# Patient Record
Sex: Male | Born: 1955 | Race: White | Hispanic: No | Smoking: Current every day smoker
Health system: Southern US, Community
[De-identification: ages and names within clinical notes are randomized; demographics above are authoritative.]

## PROBLEM LIST (undated history)

## (undated) DIAGNOSIS — I2510 Atherosclerotic heart disease of native coronary artery without angina pectoris: Secondary | ICD-10-CM

## (undated) DIAGNOSIS — Z8719 Personal history of other diseases of the digestive system: Secondary | ICD-10-CM

## (undated) DIAGNOSIS — M1990 Unspecified osteoarthritis, unspecified site: Secondary | ICD-10-CM

## (undated) DIAGNOSIS — G709 Myoneural disorder, unspecified: Secondary | ICD-10-CM

## (undated) DIAGNOSIS — I10 Essential (primary) hypertension: Secondary | ICD-10-CM

## (undated) DIAGNOSIS — T8859XA Other complications of anesthesia, initial encounter: Secondary | ICD-10-CM

## (undated) DIAGNOSIS — R011 Cardiac murmur, unspecified: Secondary | ICD-10-CM

## (undated) DIAGNOSIS — E119 Type 2 diabetes mellitus without complications: Secondary | ICD-10-CM

## (undated) DIAGNOSIS — F419 Anxiety disorder, unspecified: Secondary | ICD-10-CM

## (undated) DIAGNOSIS — F32A Depression, unspecified: Secondary | ICD-10-CM

## (undated) HISTORY — PX: AORTIC VALVE REPAIR: SHX6306

## (undated) HISTORY — PX: EYE SURGERY: SHX253

## (undated) HISTORY — PX: BONY PELVIS SURGERY: SHX572

## (undated) HISTORY — PX: ABDOMINAL HERNIA REPAIR: SHX539

---

## 2013-03-16 ENCOUNTER — Encounter (INDEPENDENT_AMBULATORY_CARE_PROVIDER_SITE_OTHER): Payer: BC Managed Care – PPO | Admitting: Ophthalmology

## 2013-03-16 DIAGNOSIS — H35039 Hypertensive retinopathy, unspecified eye: Secondary | ICD-10-CM

## 2013-03-16 DIAGNOSIS — H43819 Vitreous degeneration, unspecified eye: Secondary | ICD-10-CM

## 2013-03-16 DIAGNOSIS — H3581 Retinal edema: Secondary | ICD-10-CM

## 2013-03-16 DIAGNOSIS — E11359 Type 2 diabetes mellitus with proliferative diabetic retinopathy without macular edema: Secondary | ICD-10-CM

## 2013-03-16 DIAGNOSIS — I10 Essential (primary) hypertension: Secondary | ICD-10-CM

## 2013-03-16 DIAGNOSIS — E1139 Type 2 diabetes mellitus with other diabetic ophthalmic complication: Secondary | ICD-10-CM

## 2013-03-16 DIAGNOSIS — H4310 Vitreous hemorrhage, unspecified eye: Secondary | ICD-10-CM

## 2013-04-16 ENCOUNTER — Other Ambulatory Visit (INDEPENDENT_AMBULATORY_CARE_PROVIDER_SITE_OTHER): Payer: BC Managed Care – PPO | Admitting: Ophthalmology

## 2013-04-16 DIAGNOSIS — E1165 Type 2 diabetes mellitus with hyperglycemia: Secondary | ICD-10-CM

## 2013-04-16 DIAGNOSIS — E11359 Type 2 diabetes mellitus with proliferative diabetic retinopathy without macular edema: Secondary | ICD-10-CM

## 2013-04-16 DIAGNOSIS — E1139 Type 2 diabetes mellitus with other diabetic ophthalmic complication: Secondary | ICD-10-CM

## 2013-08-16 ENCOUNTER — Ambulatory Visit (INDEPENDENT_AMBULATORY_CARE_PROVIDER_SITE_OTHER): Payer: BC Managed Care – PPO | Admitting: Ophthalmology

## 2013-08-16 DIAGNOSIS — H2510 Age-related nuclear cataract, unspecified eye: Secondary | ICD-10-CM

## 2013-08-16 DIAGNOSIS — E1139 Type 2 diabetes mellitus with other diabetic ophthalmic complication: Secondary | ICD-10-CM

## 2013-08-16 DIAGNOSIS — E1165 Type 2 diabetes mellitus with hyperglycemia: Secondary | ICD-10-CM

## 2013-08-16 DIAGNOSIS — H35039 Hypertensive retinopathy, unspecified eye: Secondary | ICD-10-CM

## 2013-08-16 DIAGNOSIS — H43819 Vitreous degeneration, unspecified eye: Secondary | ICD-10-CM

## 2013-08-16 DIAGNOSIS — E11319 Type 2 diabetes mellitus with unspecified diabetic retinopathy without macular edema: Secondary | ICD-10-CM

## 2013-08-16 DIAGNOSIS — I10 Essential (primary) hypertension: Secondary | ICD-10-CM

## 2014-03-07 ENCOUNTER — Ambulatory Visit (INDEPENDENT_AMBULATORY_CARE_PROVIDER_SITE_OTHER): Payer: BC Managed Care – PPO | Admitting: Ophthalmology

## 2015-11-15 ENCOUNTER — Other Ambulatory Visit (HOSPITAL_COMMUNITY): Payer: Self-pay | Admitting: Endocrinology

## 2015-11-15 ENCOUNTER — Ambulatory Visit (HOSPITAL_COMMUNITY): Payer: BLUE CROSS/BLUE SHIELD | Attending: Cardiovascular Disease

## 2015-11-15 ENCOUNTER — Other Ambulatory Visit: Payer: Self-pay

## 2015-11-15 DIAGNOSIS — I517 Cardiomegaly: Secondary | ICD-10-CM | POA: Diagnosis not present

## 2015-11-15 DIAGNOSIS — I350 Nonrheumatic aortic (valve) stenosis: Secondary | ICD-10-CM

## 2015-11-16 ENCOUNTER — Other Ambulatory Visit: Payer: Self-pay | Admitting: Endocrinology

## 2015-11-16 DIAGNOSIS — I350 Nonrheumatic aortic (valve) stenosis: Secondary | ICD-10-CM

## 2015-11-21 ENCOUNTER — Telehealth: Payer: Self-pay | Admitting: Cardiovascular Disease

## 2015-11-21 NOTE — Telephone Encounter (Signed)
Received records from [facility] for appointment on 11/28/15 with Dr Berry.  Records given to N Hines (medical records) for Dr Berry's schedule on 11/28/15. lp °

## 2015-11-28 ENCOUNTER — Ambulatory Visit (INDEPENDENT_AMBULATORY_CARE_PROVIDER_SITE_OTHER): Payer: BLUE CROSS/BLUE SHIELD | Admitting: Cardiovascular Disease

## 2015-11-28 ENCOUNTER — Encounter: Payer: Self-pay | Admitting: Cardiovascular Disease

## 2015-11-28 VITALS — BP 126/80 | HR 62 | Ht 67.0 in | Wt 174.8 lb

## 2015-11-28 DIAGNOSIS — I209 Angina pectoris, unspecified: Secondary | ICD-10-CM | POA: Diagnosis not present

## 2015-11-28 DIAGNOSIS — E119 Type 2 diabetes mellitus without complications: Secondary | ICD-10-CM | POA: Insufficient documentation

## 2015-11-28 DIAGNOSIS — R0989 Other specified symptoms and signs involving the circulatory and respiratory systems: Secondary | ICD-10-CM

## 2015-11-28 DIAGNOSIS — I350 Nonrheumatic aortic (valve) stenosis: Secondary | ICD-10-CM | POA: Diagnosis not present

## 2015-11-28 DIAGNOSIS — R079 Chest pain, unspecified: Secondary | ICD-10-CM | POA: Insufficient documentation

## 2015-11-28 DIAGNOSIS — I10 Essential (primary) hypertension: Secondary | ICD-10-CM | POA: Insufficient documentation

## 2015-11-28 NOTE — Patient Instructions (Addendum)
Medication Instructions:  Continue current medications  Labwork: None Ordered  Testing/Procedures: Your physician has requested that you have a lexiscan myoview. For further information please visit [email or web address]. Please follow instruction sheet, as given.  Your physician has requested that you have a carotid duplex. This test is an ultrasound of the carotid arteries in your neck. It looks at blood flow through these arteries that supply the brain with blood. Allow one hour for this exam. There are no restrictions or special instructions.  Follow-Up: Your physician recommends that you schedule a follow-up appointment in: After test   Any Other Special Instructions Will Be Listed Below (If Applicable).   If you need a refill on your cardiac medications before your next appointment, please call your pharmacy.

## 2015-11-28 NOTE — Progress Notes (Signed)
11/28/2015 Saw Jonathon Rojas   09/30/1955  [id number]  Primary Physician Jonathon Hy, MD Primary Cardiologist: Runell Gess MD Jonathon Rojas  HPI:  Jonathon Rojas is a 60 year old mildly overweight married Caucasian male father of 2, grandfather and 5 grandchildren and is accompanied by his wife today. He is referred by Dr. Evlyn Kanner for evaluation of moderate aortic stenosis and chest pain. He was retired from [employer] since 1997 secondary to disability. His cardiovascular risk factor is notable for 50-70 pack years of tobacco abuse currently smoking one to 2 packs a day. He does have treated hypertension and diabetes. He has never had a heart attack or stroke. Does get some exertional chest discomfort. A recent 2-D echo performed 11/15/15 revealed normal LV size and function, grade 2 diastolic dysfunction and moderate aortic stenosis with a valve area 0.86 cm2 and a peak gradient of 62 mmHg.   Current Outpatient Prescriptions  Medication Sig Dispense Refill  . ALPRAZolam (XANAX) 0.25 MG tablet Take 1 tablet by mouth as needed.    . Azelastine HCl 0.15 % SOLN Place 1 puff into the nose 2 (two) times daily.    . carisoprodol (SOMA) 350 MG tablet Take 350 mg by mouth 4 (four) times daily.    Marland Kitchen diltiazem (TIAZAC) 360 MG 24 hr capsule Take 1 capsule by mouth daily.    Marland Kitchen oxyCODONE-acetaminophen (PERCOCET) 7.5-325 MG tablet Take 1 tablet by mouth 4 (four) times daily.    . pantoprazole (PROTONIX) 40 MG tablet Take 1 tablet by mouth 2 (two) times daily.    Marland Kitchen zolpidem (AMBIEN) 10 MG tablet Take 1 tablet by mouth at bedtime.     No current facility-administered medications for this visit.     Allergies  Allergen Reactions  . Canagliflozin Swelling  . Cyclobenzaprine Hives  . Ibuprofen Hives  . Metformin And Related Swelling  . Other Hives  . Robaxin [Methocarbamol] Hives    Social History   Social History  . Marital status: Significant Other    Spouse name: N/A  . Number of  children: N/A  . Years of education: N/A   Occupational History  . Not on file.   Social History Main Topics  . Smoking status: Not on file  . Smokeless tobacco: Not on file  . Alcohol use Not on file  . Drug use: Unknown  . Sexual activity: Not on file   Other Topics Concern  . Not on file   Social History Narrative  . No narrative on file     Review of Systems: General: negative for chills, fever, night sweats or weight changes.  Cardiovascular: negative for chest pain, dyspnea on exertion, edema, orthopnea, palpitations, paroxysmal nocturnal dyspnea or shortness of breath Dermatological: negative for rash Respiratory: negative for cough or wheezing Urologic: negative for hematuria Abdominal: negative for nausea, vomiting, diarrhea, bright red blood per rectum, melena, or hematemesis Neurologic: negative for visual changes, syncope, or dizziness All other systems reviewed and are otherwise negative except as noted above.    Blood pressure 126/80, pulse 62, height 5\' 7"  (1.702 m), weight 174 lb 12.8 oz (79.3 kg).  General appearance: alert and no distress Neck: no adenopathy, no JVD, supple, symmetrical, trachea midline, thyroid not enlarged, symmetric, no tenderness/mass/nodules and Bilateral carotid bruits versus transmitted murmur Lungs: clear to auscultation bilaterally Heart: 2/6 systolic ejection murmur at the base as well as apex consistent with aortic stenosis Extremities: extremities normal, atraumatic, no cyanosis or edema  EKG normal sinus  rhythm at 63 without ST or T-wave changes. I personally reviewed this EKG  ASSESSMENT AND PLAN:   Aortic stenosis, moderate [name] recently had a 2-D echocardiogram performed 11/15/15 revealing normal LV size and function with mild LVH and moderate aortic stenosis. His valve area was 0.86 cm 2 with a peak gradient of 62 mmHg. He does get occasional chest discomfort when walking up stairs.  Essential hypertension History  of hypertension blood pressure measured 126/80. He is on diltiazem. Continue current meds at current dosing  Chest pain The patient describes occasional exertional chest pain walking up stairs. He does have moderate aortic stenosis as well as risk factors that include hypertension, diabetes and 50-75 pack years of tobacco abuse currently smoking one to 2 packs a day. I'm going to get a formal neurologic Myoview stress test to rule out an ischemic etiology.      Runell GessJonathan J. Johna Kearl MD FACP,FACC,FAHA, [facility] 11/28/2015 11:04 AM

## 2015-11-28 NOTE — Assessment & Plan Note (Signed)
The patient describes occasional exertional chest pain walking up stairs. He does have moderate aortic stenosis as well as risk factors that include hypertension, diabetes and 50-75 pack years of tobacco abuse currently smoking one to 2 packs a day. I'm going to get a formal neurologic Myoview stress test to rule out an ischemic etiology.

## 2015-11-28 NOTE — Assessment & Plan Note (Signed)
Mr. Jonathon Rojas recently had a 2-D echocardiogram performed 11/15/15 revealing normal LV size and function with mild LVH and moderate aortic stenosis. His valve area was 0.86 cm 2 with a peak gradient of 62 mmHg. He does get occasional chest discomfort when walking up stairs.

## 2015-11-28 NOTE — Assessment & Plan Note (Signed)
History of hypertension blood pressure measured 126/80. He is on diltiazem. Continue current meds at current dosing

## 2015-12-08 ENCOUNTER — Telehealth (HOSPITAL_COMMUNITY): Payer: Self-pay

## 2015-12-08 NOTE — Telephone Encounter (Signed)
Encounter complete. 

## 2015-12-13 ENCOUNTER — Ambulatory Visit (HOSPITAL_COMMUNITY)
Admission: RE | Admit: 2015-12-13 | Discharge: 2015-12-13 | Disposition: A | Discharge status: Home / Self Care | Payer: BLUE CROSS/BLUE SHIELD | Source: Ambulatory Visit | Attending: Cardiovascular Disease | Admitting: Cardiovascular Disease

## 2015-12-13 DIAGNOSIS — I10 Essential (primary) hypertension: Secondary | ICD-10-CM | POA: Insufficient documentation

## 2015-12-13 DIAGNOSIS — E119 Type 2 diabetes mellitus without complications: Secondary | ICD-10-CM | POA: Insufficient documentation

## 2015-12-13 DIAGNOSIS — Z720 Tobacco use: Secondary | ICD-10-CM | POA: Insufficient documentation

## 2015-12-13 DIAGNOSIS — I6523 Occlusion and stenosis of bilateral carotid arteries: Secondary | ICD-10-CM | POA: Insufficient documentation

## 2015-12-13 DIAGNOSIS — R079 Chest pain, unspecified: Secondary | ICD-10-CM | POA: Diagnosis not present

## 2015-12-13 DIAGNOSIS — R0989 Other specified symptoms and signs involving the circulatory and respiratory systems: Secondary | ICD-10-CM | POA: Diagnosis present

## 2015-12-13 DIAGNOSIS — I350 Nonrheumatic aortic (valve) stenosis: Secondary | ICD-10-CM | POA: Insufficient documentation

## 2015-12-13 LAB — MYOCARDIAL PERFUSION IMAGING
CHL CUP NUCLEAR SDS: 1
CHL CUP RESTING HR STRESS: 60 {beats}/min
CSEPPHR: 64 {beats}/min
LV sys vol: 54 mL
LVDIAVOL: 123 mL (ref 62–150)
NUC STRESS TID: 1.28
SRS: 0
SSS: 1

## 2015-12-13 MED ORDER — AMINOPHYLLINE 25 MG/ML IV SOLN
75.0000 mg | Freq: Once | INTRAVENOUS | Status: AC
  Administered 2015-12-13: 75 mg via INTRAVENOUS

## 2015-12-13 MED ORDER — REGADENOSON 0.4 MG/5ML IV SOLN
0.4000 mg | Freq: Once | INTRAVENOUS | Status: AC
  Administered 2015-12-13: 0.4 mg via INTRAVENOUS

## 2015-12-13 MED ORDER — TECHNETIUM TC 99M TETROFOSMIN IV KIT
31.2000 | PACK | Freq: Once | INTRAVENOUS | Status: AC | PRN
  Administered 2015-12-13: 31.2 via INTRAVENOUS
  Filled 2015-12-13: qty 31

## 2015-12-13 MED ORDER — TECHNETIUM TC 99M TETROFOSMIN IV KIT
10.2000 | PACK | Freq: Once | INTRAVENOUS | Status: AC | PRN
  Administered 2015-12-13: 10.2 via INTRAVENOUS
  Filled 2015-12-13: qty 10

## 2015-12-14 DIAGNOSIS — R0989 Other specified symptoms and signs involving the circulatory and respiratory systems: Secondary | ICD-10-CM | POA: Diagnosis not present

## 2016-01-02 ENCOUNTER — Encounter: Payer: Self-pay | Admitting: Cardiovascular Disease

## 2016-01-02 ENCOUNTER — Ambulatory Visit (INDEPENDENT_AMBULATORY_CARE_PROVIDER_SITE_OTHER): Payer: BLUE CROSS/BLUE SHIELD | Admitting: Cardiovascular Disease

## 2016-01-02 VITALS — BP 149/74 | HR 60 | Ht 67.0 in | Wt 175.4 lb

## 2016-01-02 DIAGNOSIS — I350 Nonrheumatic aortic (valve) stenosis: Secondary | ICD-10-CM

## 2016-01-02 NOTE — Patient Instructions (Signed)
Medication Instructions:  Continue on current medications.   Testing/Procedures: Your physician has requested that you have an echocardiogram. Echocardiography is a painless test that uses sound waves to create images of your heart. It provides your doctor with information about the size and shape of your heart and how well your heart's chambers and valves are working. This procedure takes approximately one hour. There are no restrictions for this procedure.  DUE IN AUGUST 2018.    Follow-Up: We request that you follow-up in: 6 MONTHS with an extender and in 12 MONTHS with Dr San MorelleBerry  You will receive a reminder letter in the mail two months in advance. If you don't receive a letter, please call our office to schedule the follow-up appointment.   If you need a refill on your cardiac medications before your next appointment, please call your pharmacy.

## 2016-01-02 NOTE — Assessment & Plan Note (Signed)
Mr. Jonathon Rojas returns today for evaluation of chest pain. Myoview stress test was nonischemic. His chest pain is somewhat atypical and sounds more like reflux. She does have moderate aortic stenosis. We will recheck a 2-D echo in 1 year. He'll see a PA back in 6 months to me back in one year. She had progressive symptoms he will need a right left heart cath.

## 2016-01-02 NOTE — Progress Notes (Signed)
Mr. Teigen returns today for evaluation of chest pain. Myoview stress test was nonischemic. His chest pain is somewhat atypical and sounds more like reflux. She does have moderate aortic stenosis. We will recheck a 2-D echo in 1 year. He'll see a PA back in 6 months to me back in one year. She had progressive symptoms he will need a right left heart cath. 

## 2016-06-13 ENCOUNTER — Encounter (HOSPITAL_COMMUNITY): Payer: Self-pay | Admitting: Cardiovascular Disease

## 2016-11-15 ENCOUNTER — Ambulatory Visit (HOSPITAL_COMMUNITY): Payer: BLUE CROSS/BLUE SHIELD | Attending: Cardiovascular Disease

## 2016-11-15 ENCOUNTER — Other Ambulatory Visit: Payer: Self-pay

## 2016-11-15 DIAGNOSIS — R079 Chest pain, unspecified: Secondary | ICD-10-CM | POA: Diagnosis not present

## 2016-11-15 DIAGNOSIS — I350 Nonrheumatic aortic (valve) stenosis: Secondary | ICD-10-CM | POA: Diagnosis present

## 2016-11-21 ENCOUNTER — Telehealth: Payer: Self-pay | Admitting: Cardiovascular Disease

## 2016-11-21 NOTE — Telephone Encounter (Signed)
Follow up     If you cant reach him on his number use his wifes number [phone] Jonathon Rojas

## 2016-11-21 NOTE — Telephone Encounter (Signed)
New Message     Pt would like a call back regarding the results for the echo, before his appt on 01/07/17

## 2016-11-21 NOTE — Telephone Encounter (Signed)
Results of echo given to pt. Pt wanted to schedule a sooner appt with Dr. Allyson SabalBerry. Appt previously scheduled for 10/23 was changed to 9/18 at 4 pm with Dr. Allyson SabalBerry to further discuss echo results.

## 2016-11-26 ENCOUNTER — Ambulatory Visit: Payer: BLUE CROSS/BLUE SHIELD | Admitting: Cardiovascular Disease

## 2016-12-03 ENCOUNTER — Ambulatory Visit (INDEPENDENT_AMBULATORY_CARE_PROVIDER_SITE_OTHER): Payer: BLUE CROSS/BLUE SHIELD | Admitting: Cardiovascular Disease

## 2016-12-03 ENCOUNTER — Encounter: Payer: Self-pay | Admitting: Cardiovascular Disease

## 2016-12-03 VITALS — BP 150/79 | HR 60 | Ht 68.0 in | Wt 175.6 lb

## 2016-12-03 DIAGNOSIS — I350 Nonrheumatic aortic (valve) stenosis: Secondary | ICD-10-CM

## 2016-12-03 DIAGNOSIS — I10 Essential (primary) hypertension: Secondary | ICD-10-CM

## 2016-12-03 NOTE — Assessment & Plan Note (Signed)
History of essential hypertension blood pressure measured at 150/79. He is on diltiazem. Continue current meds at current dosing

## 2016-12-03 NOTE — Patient Instructions (Signed)
Medication Instructions: Your physician recommends that you continue on your current medications as directed. Please refer to the Current Medication list given to you today.  Testing/Procedures: Your physician has requested that you have an echocardiogram in 6 months. Echocardiography is a painless test that uses sound waves to create images of your heart. It provides your doctor with information about the size and shape of your heart and how well your heart's chambers and valves are working. This procedure takes approximately one hour. There are no restrictions for this procedure.  Follow-Up: Your physician wants you to follow-up in: 6 months with Dr. Gerrie Nordmann echo is completed. You will receive a reminder letter in the mail two months in advance. If you don't receive a letter, please call our office to schedule the follow-up appointment.  If you need a refill on your cardiac medications before your next appointment, please call your pharmacy.

## 2016-12-03 NOTE — Assessment & Plan Note (Signed)
History of progressive and now severe aortic stenosis with a 2-D echo performed 11/15/16 revealing an aortic valve area of 0.72 cm with a peak gradient of 77 which has increased from 62 mmHg one year ago. He remains asymptomatic. I will recheck a 2-D echo in 6 months.

## 2016-12-03 NOTE — Progress Notes (Signed)
12/03/2016 [name]   1956/03/10  [id number]  Primary Physician Adrian Prince, MD Primary Cardiologist: Runell Gess MD Nicholes Calamity, [place]  HPI:  Jonathon Rojas is a 61 y.o. male mildly overweight married Caucasian male father of 2, grandfather and 5 grandchildren and is accompanied by his wife today. He is referred by Dr. Evlyn Kanner for evaluation of moderate aortic stenosis and chest pain. He was retired from [employer] since 1997 secondary to disability. His cardiovascular risk factor is notable for 50-70 pack years of tobacco abuse currently smoking one to 2 packs a day. He does have treated hypertension and diabetes. He has never had a heart attack or stroke. Does get some exertional chest discomfort. A recent 2-D echo performed 11/15/15 revealed normal LV size and function, grade 2 diastolic dysfunction and moderate aortic stenosis with a valve area 0.86 cm2 and a peak gradient of 62 mmHg.  Since I saw me ago he's remained medically stable Chest pain or shortness of breath. Recent 2-D echo performed 11/15/16 revealed an aortic valve area 0.72 cm with an increase in his gradient of 62 mmHg to 77 mmHg.   Current Meds  Medication Sig  . ALPRAZolam (XANAX) 0.25 MG tablet Take 1 tablet by mouth as needed.  . Azelastine HCl 0.15 % SOLN Place 1 puff into the nose 2 (two) times daily.  . carisoprodol (SOMA) 350 MG tablet Take 350 mg by mouth 4 (four) times daily.  . Diclofenac Sodium (PENNSAID) 1.5 % SOLN Place onto the skin daily.  Marland Kitchen diltiazem (TIAZAC) 360 MG 24 hr capsule Take 1 capsule by mouth daily.  Marland Kitchen oxyCODONE-acetaminophen (PERCOCET) 7.5-325 MG tablet Take 1 tablet by mouth 4 (four) times daily.  . pantoprazole (PROTONIX) 40 MG tablet Take 1 tablet by mouth 2 (two) times daily.  Marland Kitchen zolpidem (AMBIEN) 10 MG tablet Take 1 tablet by mouth at bedtime.     Allergies  Allergen Reactions  . Ivp Dye [Iodinated Diagnostic Agents] Anaphylaxis    Pt has EPI PEN for this allergy.  .  Shellfish Allergy Anaphylaxis    Pt has EPI PEN for this allergy.  . Canagliflozin Swelling  . Flexeril [Cyclobenzaprine] Hives  . Ibuprofen Hives  . Metformin And Related Swelling  . Other Hives  . Robaxin [Methocarbamol] Hives    Social History   Social History  . Marital status: Significant Other    Spouse name: N/A  . Number of children: N/A  . Years of education: N/A   Occupational History  . Not on file.   Social History Main Topics  . Smoking status: Current Every Day Smoker    Packs/day: 1.00  . Smokeless tobacco: Not on file  . Alcohol use Not on file  . Drug use: Unknown  . Sexual activity: Not on file   Other Topics Concern  . Not on file   Social History Narrative  . No narrative on file     Review of Systems: General: negative for chills, fever, night sweats or weight changes.  Cardiovascular: negative for chest pain, dyspnea on exertion, edema, orthopnea, palpitations, paroxysmal nocturnal dyspnea or shortness of breath Dermatological: negative for rash Respiratory: negative for cough or wheezing Urologic: negative for hematuria Abdominal: negative for nausea, vomiting, diarrhea, bright red blood per rectum, melena, or hematemesis Neurologic: negative for visual changes, syncope, or dizziness All other systems reviewed and are otherwise negative except as noted above.    Blood pressure (!) 150/79, pulse 60, height  (1.727  m), weight 175 lb 9.6 oz (79.7 kg).  General appearance: alert and no distress Neck: no adenopathy, no JVD, supple, symmetrical, trachea midline, thyroid not enlarged, symmetric, no tenderness/mass/nodules and Soft bilateral carotid bruits versus transmitted murmur Lungs: clear to auscultation bilaterally Heart: 2/6 systolic ejection murmur at the base consistent with aortic stenosis. Extremities: extremities normal, atraumatic, no cyanosis or edema Pulses: 2+ and symmetric Skin: Skin color, texture, turgor normal. No rashes  or lesions Neurologic: Alert and oriented X 3, normal strength and tone. Normal symmetric reflexes. Normal coordination and gait  EKG not performed today  ASSESSMENT AND PLAN:   Aortic stenosis, moderate History of progressive and now severe aortic stenosis with a 2-D echo performed 11/15/16 revealing an aortic valve area of 0.72 cm with a peak gradient of 77 which has increased from 62 mmHg one year ago. He remains asymptomatic. I will recheck a 2-D echo in 6 months.  Essential hypertension History of essential hypertension blood pressure measured at 150/79. He is on diltiazem. Continue current meds at current dosing      Runell Gess MD [facility], [facility] 12/03/2016 4:25 PM

## 2016-12-26 ENCOUNTER — Encounter (HOSPITAL_COMMUNITY): Payer: Self-pay | Admitting: Emergency Medicine

## 2016-12-26 ENCOUNTER — Emergency Department (HOSPITAL_COMMUNITY): Payer: BLUE CROSS/BLUE SHIELD

## 2016-12-26 ENCOUNTER — Emergency Department (HOSPITAL_COMMUNITY)
Admission: EM | Admit: 2016-12-26 | Discharge: 2016-12-27 | Disposition: A | Discharge status: Home / Self Care | Payer: BLUE CROSS/BLUE SHIELD | Attending: Emergency Medicine | Admitting: Emergency Medicine

## 2016-12-26 DIAGNOSIS — E119 Type 2 diabetes mellitus without complications: Secondary | ICD-10-CM | POA: Diagnosis not present

## 2016-12-26 DIAGNOSIS — I10 Essential (primary) hypertension: Secondary | ICD-10-CM | POA: Insufficient documentation

## 2016-12-26 DIAGNOSIS — J189 Pneumonia, unspecified organism: Secondary | ICD-10-CM | POA: Diagnosis not present

## 2016-12-26 DIAGNOSIS — Z79899 Other long term (current) drug therapy: Secondary | ICD-10-CM | POA: Diagnosis not present

## 2016-12-26 DIAGNOSIS — R0602 Shortness of breath: Secondary | ICD-10-CM | POA: Diagnosis present

## 2016-12-26 LAB — CBC
HEMATOCRIT: 33.7 % — AB (ref 39.0–52.0)
HEMOGLOBIN: 11.7 g/dL — AB (ref 13.0–17.0)
MCH: 28.5 pg (ref 26.0–34.0)
MCHC: 34.7 g/dL (ref 30.0–36.0)
MCV: 82.2 fL (ref 78.0–100.0)
Platelets: 382 10*3/uL (ref 150–400)
RBC: 4.1 MIL/uL — AB (ref 4.22–5.81)
RDW: 13.2 % (ref 11.5–15.5)
WBC: 17 10*3/uL — ABNORMAL HIGH (ref 4.0–10.5)

## 2016-12-26 LAB — BASIC METABOLIC PANEL
ANION GAP: 12 (ref 5–15)
BUN: 10 mg/dL (ref 6–20)
CHLORIDE: 92 mmol/L — AB (ref 101–111)
CO2: 25 mmol/L (ref 22–32)
CREATININE: 0.59 mg/dL — AB (ref 0.61–1.24)
Calcium: 8.8 mg/dL — ABNORMAL LOW (ref 8.9–10.3)
GFR calc non Af Amer: >60 mL/min (ref >60)
Glucose, Bld: 162 mg/dL — ABNORMAL HIGH (ref 65–99)
POTASSIUM: 3.2 mmol/L — AB (ref 3.5–5.1)
SODIUM: 129 mmol/L — AB (ref 135–145)

## 2016-12-26 LAB — BRAIN NATRIURETIC PEPTIDE: B NATRIURETIC PEPTIDE 5: 339.2 pg/mL — AB (ref 0.0–100.0)

## 2016-12-26 MED ORDER — IPRATROPIUM-ALBUTEROL 0.5-2.5 (3) MG/3ML IN SOLN
3.0000 mL | Freq: Once | RESPIRATORY_TRACT | Status: AC
  Administered 2016-12-26: 3 mL via RESPIRATORY_TRACT
  Filled 2016-12-26: qty 3

## 2016-12-26 MED ORDER — SODIUM CHLORIDE 0.9 % IV BOLUS (SEPSIS)
1000.0000 mL | Freq: Once | INTRAVENOUS | Status: AC
  Administered 2016-12-26: 1000 mL via INTRAVENOUS

## 2016-12-26 MED ORDER — DEXTROSE 5 % IV SOLN
1.0000 g | Freq: Once | INTRAVENOUS | Status: AC
  Administered 2016-12-26: 1 g via INTRAVENOUS
  Filled 2016-12-26: qty 10

## 2016-12-26 MED ORDER — AMOXICILLIN 500 MG PO CAPS: 500.0000 mg | ORAL_CAPSULE | Freq: Three times a day (TID) | ORAL | 0 refills | Status: DC

## 2016-12-26 NOTE — ED Triage Notes (Signed)
Patient here from home with complaints of SOB upon exertion. Productive cough. Reports chest x-ray yesterday possible pneumonia.

## 2016-12-26 NOTE — Discharge Instructions (Signed)
Take the antibiotics as prescribed, follow up with your primary care doctor to make sure you are improving, continue the azithromycin and take the amoxicillin instead of the augmentin

## 2016-12-26 NOTE — ED Provider Notes (Signed)
[facility]-EMERGENCY DEPT Provider Note   CSN: [id number] Arrival date & time: 12/26/16  1830     History   Chief Complaint Chief Complaint  Patient presents with  . Shortness of Breath    HPI Jonathon Rojas is a 61 y.o. male.  HPI Patient presents to the emergency room with complaints of shortness of breath. Patient states his symptoms initially started a few days ago. It began with an earache and ear infection. He started taking Augmentin for that. He is felt that since that time he's had increasing cough and congestion and also is feeling short of breath. Patient went to his primary care doctor yesterday. He was told he could have a pneumonia or there could be an abnormality related to her cancer. They instructed him to continue the augmentin and start azithromycin. Patient states he is not feeling any better so he came to the emergency room. He denies any chest pain. No abdominal pain. No vomiting or diarrhea. He does smoke. History reviewed. No pertinent past medical history.  Patient Active Problem List   Diagnosis Date Noted  . Aortic stenosis, moderate 11/28/2015  . Essential hypertension 11/28/2015  . Diabetes (HCC) 11/28/2015  . Chest pain 11/28/2015    History reviewed. No pertinent surgical history.     Home Medications    Prior to Admission medications   Medication Sig Start Date End Date Taking? Authorizing Provider  ALPRAZolam Prudy Feeler) 0.25 MG tablet Take 1 tablet by mouth 2 (two) times daily as needed for anxiety.  06/28/15  Yes [provider]  Azelastine HCl 0.15 % SOLN Place 1 puff into the nose 2 (two) times daily. 08/29/15  Yes [provider]  azithromycin (ZITHROMAX) 250 MG tablet Take 250 mg by mouth daily.  12/25/16  Yes [provider]  carisoprodol (SOMA) 350 MG tablet Take 350 mg by mouth 4 (four) times daily as needed for muscle spasms.    Yes [provider]  Diclofenac Sodium (PENNSAID) 1.5 % SOLN Place onto the skin  daily.   Yes [provider]  diltiazem (TIAZAC) 360 MG 24 hr capsule Take 1 capsule by mouth every evening.  07/24/15  Yes [provider]  fluticasone (FLONASE) 50 MCG/ACT nasal spray Place 2 sprays into both nostrils 2 (two) times daily.  12/15/16  Yes [provider]  nystatin (MYCOSTATIN) 100000 UNIT/ML suspension TAKE 1 TEASPOONFUL BY MOUTH SWISH AND SPIT 4 TIMES DAILY 12/21/16  Yes [provider]  oxyCODONE-acetaminophen (PERCOCET) 7.5-325 MG tablet Take 1 tablet by mouth 4 (four) times daily. 08/23/15  Yes [provider]  pantoprazole (PROTONIX) 40 MG tablet Take 1 tablet by mouth 2 (two) times daily. 07/07/15  Yes [provider]  PROAIR HFA 108 (90 Base) MCG/ACT inhaler Inhale 2 puffs into the lungs every 6 (six) hours as needed for wheezing or shortness of breath.  12/25/16  Yes [provider]  zolpidem (AMBIEN) 10 MG tablet Take 1 tablet by mouth at bedtime. 09/08/15  Yes [provider]  amoxicillin (AMOXIL) 500 MG capsule Take 1 capsule (500 mg total) by mouth 3 (three) times daily. 12/26/16   Linwood Dibbles, MD    Family History No family history on file.  Social History Social History  Substance Use Topics  . Smoking status: Never Smoker  . Smokeless tobacco: Never Used  . Alcohol use No     Allergies   Ivp dye [iodinated diagnostic agents]; Shellfish allergy; Canagliflozin; Flexeril [cyclobenzaprine]; Ibuprofen; Metformin and related; Other; and  Robaxin [methocarbamol]   Review of Systems Review of Systems  All other systems reviewed and are negative.    Physical Exam Updated Vital Signs BP (!) 162/80 (BP Location: Left Arm)   Pulse 90   Temp 99.9 F (37.7 C) (Oral)   Resp 14   SpO2 97%   Physical Exam  Constitutional: No distress.  HENT:  Head: Normocephalic and atraumatic.  Right Ear: External ear normal.  Left Ear: External ear normal.  No erythema bilateral tms  Eyes: Conjunctivae are  normal. Right eye exhibits no discharge. Left eye exhibits no discharge. No scleral icterus.  Neck: Neck supple. No tracheal deviation present.  Cardiovascular: Normal rate, regular rhythm and intact distal pulses.   Pulmonary/Chest: Effort normal and breath sounds normal. No stridor. No respiratory distress. He has no wheezes. He has no rales.  Abdominal: Soft. Bowel sounds are normal. He exhibits no distension. There is no tenderness. There is no rebound and no guarding.  Musculoskeletal: He exhibits no edema or tenderness.  Lower leg braces   Neurological: He is alert. He has normal strength. No cranial nerve deficit (no facial droop, extraocular movements intact, no slurred speech) or sensory deficit. He exhibits normal muscle tone. He displays no seizure activity. Coordination normal.  Skin: Skin is warm and dry. No rash noted. He is not diaphoretic.  Psychiatric: He has a normal mood and affect.  Nursing note and vitals reviewed.    ED Treatments / Results  Labs (all labs ordered are listed, but only abnormal results are displayed) Labs Reviewed  CBC - Abnormal; Notable for the following:       Result Value   WBC 17.0 (*)    RBC 4.10 (*)    Hemoglobin 11.7 (*)    HCT 33.7 (*)    All other components within normal limits  BASIC METABOLIC PANEL - Abnormal; Notable for the following:    Sodium 129 (*)    Potassium 3.2 (*)    Chloride 92 (*)    Glucose, Bld 162 (*)    Creatinine, Ser 0.59 (*)    Calcium 8.8 (*)    All other components within normal limits  BRAIN NATRIURETIC PEPTIDE - Abnormal; Notable for the following:    B Natriuretic Peptide 339.2 (*)    All other components within normal limits    EKG  EKG Interpretation  Date/Time:  Thursday December 26 2016 19:15:23 EDT Ventricular Rate:  85 PR Interval:    QRS Duration: 84 QT Interval:  453 QTC Calculation: 539 R Axis:   78 Text Interpretation:  Sinus rhythm Minimal ST depression, diffuse leads Prolonged QT  interval No old tracing to compare Confirmed by Linwood Dibbles [phone]) on 12/26/2016 7:34:59 PM       Radiology Dg Chest 2 View  Result Date: 12/26/2016 CLINICAL DATA:  Shortness of breath with exertion, productive cough, smoker, abnormal chest radiograph question pneumonia. EXAM: CHEST  2 VIEW COMPARISON:  None. FINDINGS: Normal heart size, mediastinal contours, and pulmonary vascularity. BILATERAL perihilar infiltrates question pneumonia. No pneumothorax. Tiny pleural effusions blunt the posterior costophrenic angles. Bones unremarkable. IMPRESSION: Perihilar infiltrates question pneumonia. Electronically Signed   By: Ulyses Southward M.D.   On: 12/26/2016 20:29    Procedures Procedures (including critical care time)  Medications Ordered in ED Medications  ipratropium-albuterol (DUONEB) 0.5-2.5 (3) MG/3ML nebulizer solution 3 mL (3 mLs Nebulization Given 12/26/16 1945)  cefTRIAXone (ROCEPHIN) 1 g in dextrose 5 % 50 mL IVPB (0 g Intravenous Stopped 12/26/16 2230)  sodium chloride 0.9 % bolus 1,000 mL (1,000 mLs Intravenous New Bag/Given 12/26/16 2200)     Initial Impression / Assessment and Plan / ED Course  I have reviewed the triage vital signs and the nursing notes.  Pertinent labs & imaging results that were available during my care of the patient were reviewed by me and considered in my medical decision making (see chart for details).  Clinical Course as of Dec 27 2310  Thu Dec 26, 2016  2306 Port score 82.  Risk class 3  [JK]    Clinical Course User Index [JK] Linwood Dibbles, MD    CXR shows pneumonia which is consistent with his clinical picture.  Pt has an elevated wbc count and decreased sodium but non toxic.  No fever.  No tachypnea or oxygen requirement.  Will have pt take azithromycin and change to amoxicillin because of the side effects he is having with augmentin.  Dose of rocephin given in the ED.   Follow up with PCP  Final Clinical Impressions(s) / ED Diagnoses   Final  diagnoses:  Community acquired pneumonia, unspecified laterality    New Prescriptions New Prescriptions   AMOXICILLIN (AMOXIL) 500 MG CAPSULE    Take 1 capsule (500 mg total) by mouth 3 (three) times daily.     Linwood Dibbles, MD 12/26/16 2312

## 2016-12-26 NOTE — ED Notes (Signed)
Bed: WA06 Expected date:  Expected time:  Means of arrival:  Comments: EMS-dyspnea on exertion

## 2017-01-07 ENCOUNTER — Ambulatory Visit: Payer: BLUE CROSS/BLUE SHIELD | Admitting: Cardiovascular Disease

## 2017-01-10 ENCOUNTER — Other Ambulatory Visit: Payer: Self-pay

## 2017-04-25 ENCOUNTER — Encounter (HOSPITAL_COMMUNITY): Payer: Self-pay | Admitting: Radiology

## 2017-05-12 ENCOUNTER — Telehealth (HOSPITAL_COMMUNITY): Payer: Self-pay | Admitting: Cardiovascular Disease

## 2017-05-12 NOTE — Telephone Encounter (Signed)
04/18/17 Called pt and lmsg for him to CB to get scheduled for an echo..RG 04/21/17 Called pt and lmsg for him to Cb to get scheduled for an echo..RG 04/24/17 LMOM to schedule echocardiogram. EVD 05/06/17 LMOM to schedule echocardiogram EVD 05/12/17 Called pt and lmsg for him to CB to sch echo..RG  [facility].

## 2018-12-15 IMAGING — CR DG CHEST 2V
2 series · 2 of 2 positions shown · non-contrast
Comparison: None.

CLINICAL DATA: Shortness of breath with exertion, productive cough,
smoker, abnormal chest radiograph question pneumonia.

EXAM:
CHEST  2 VIEW

[w chest pa]
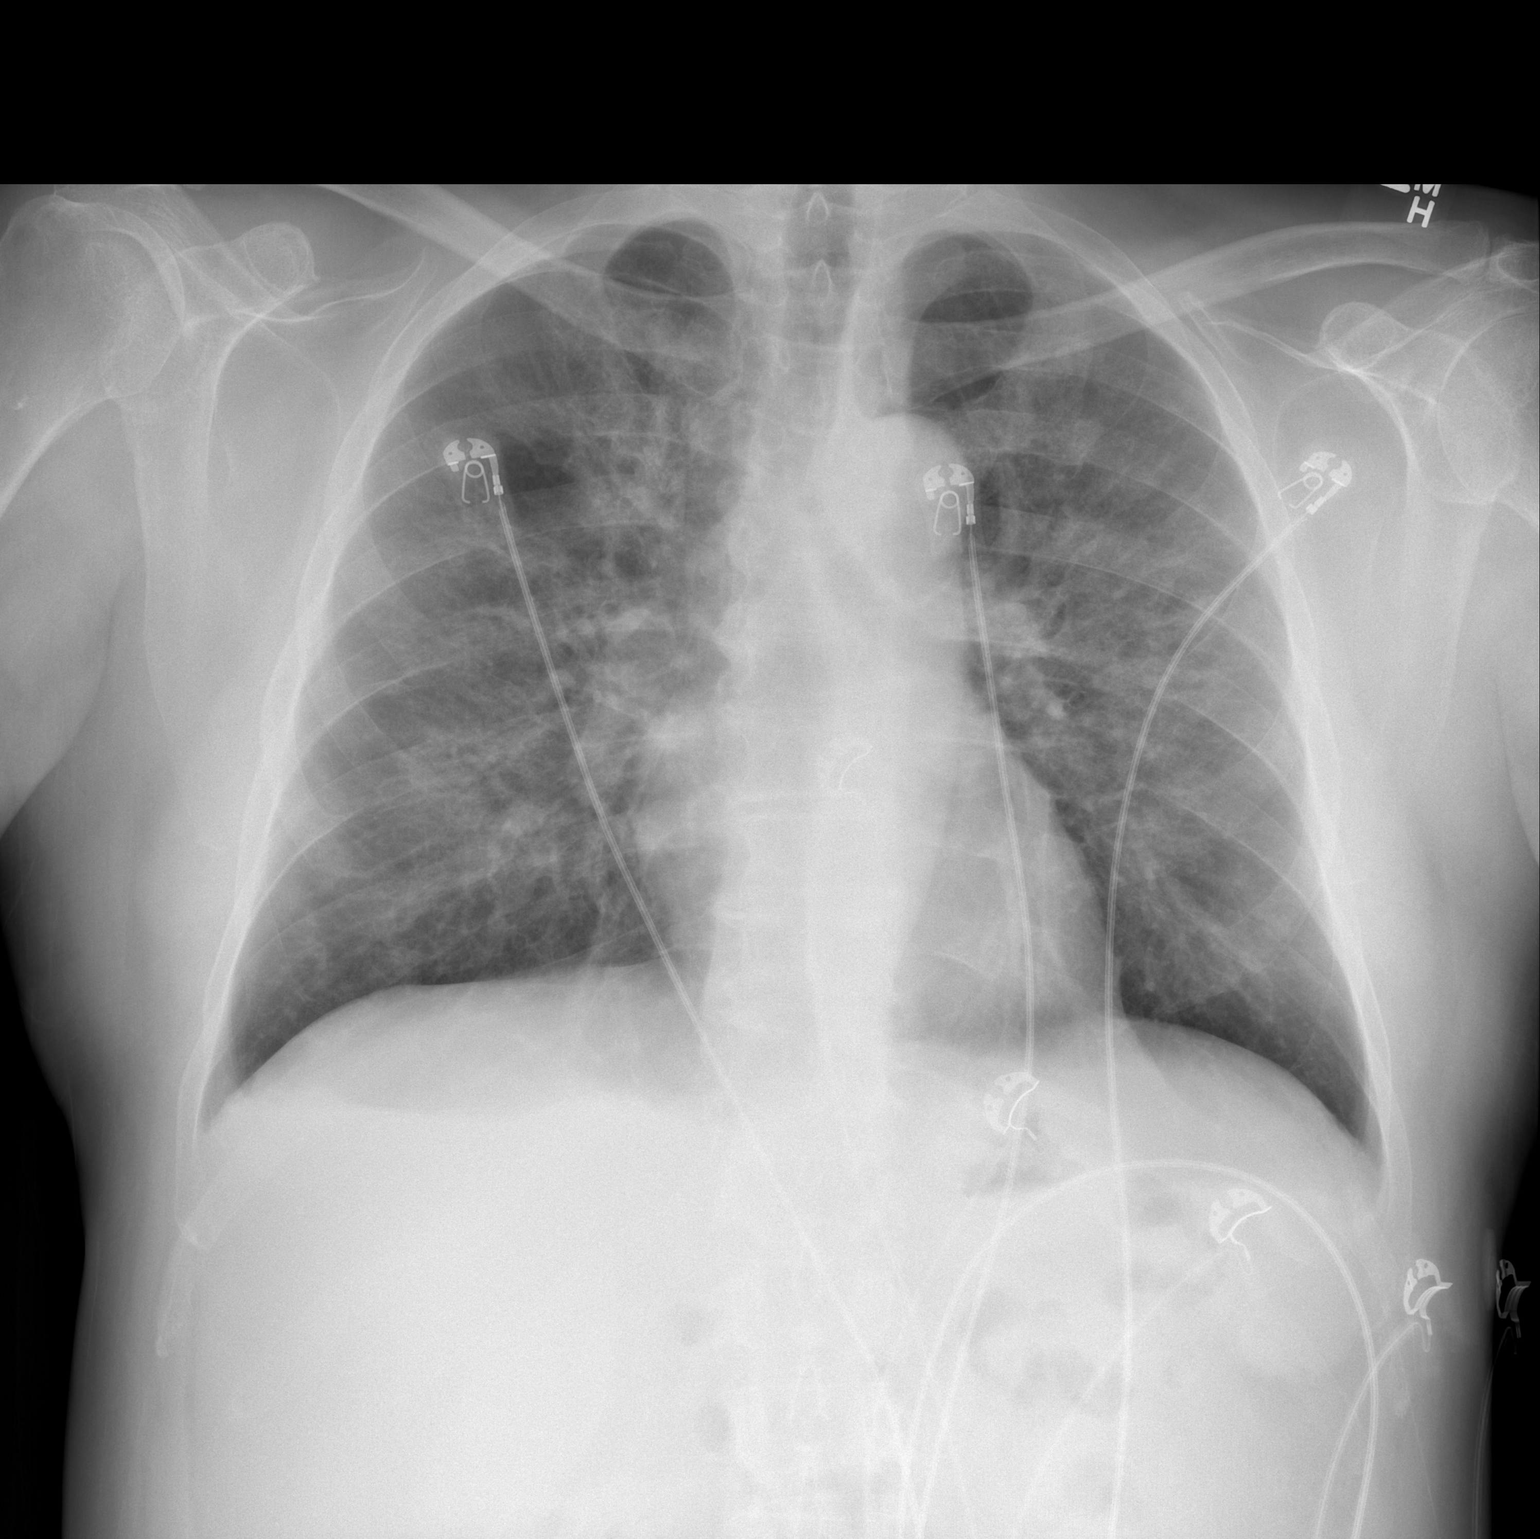

[w chest lat]
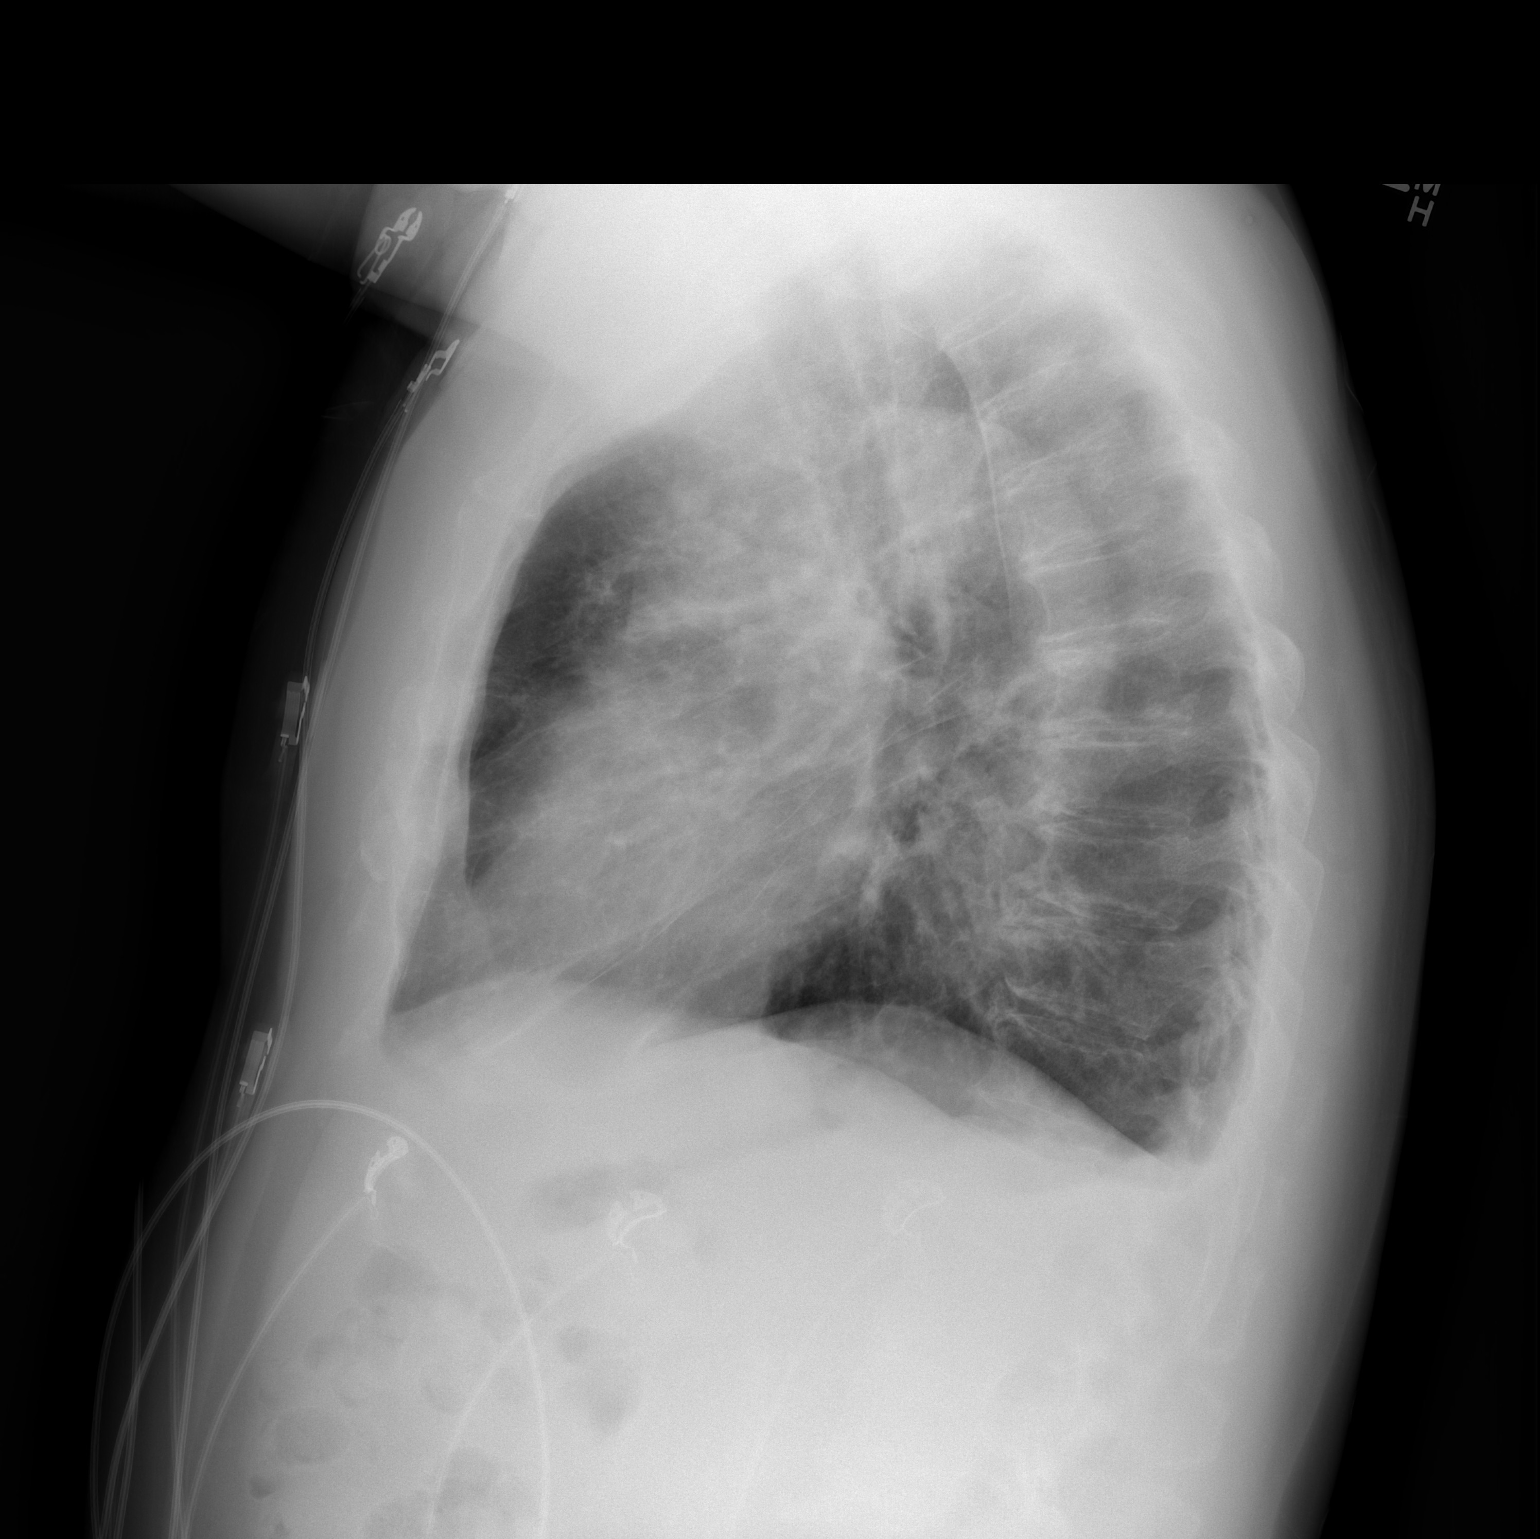

[2 of 2 positions shown; findings below may reference images not displayed]

FINDINGS: Normal heart size, mediastinal contours, and pulmonary vascularity.

BILATERAL perihilar infiltrates question pneumonia.

No pneumothorax.

Tiny pleural effusions blunt the posterior costophrenic angles.

Bones unremarkable.
IMPRESSION: Perihilar infiltrates question pneumonia.

## 2019-11-17 ENCOUNTER — Other Ambulatory Visit: Payer: Self-pay | Admitting: Surgery

## 2019-11-19 ENCOUNTER — Other Ambulatory Visit: Payer: Self-pay | Admitting: Surgery

## 2019-11-19 DIAGNOSIS — R1084 Generalized abdominal pain: Secondary | ICD-10-CM

## 2019-11-24 ENCOUNTER — Ambulatory Visit
Admission: RE | Admit: 2019-11-24 | Discharge: 2019-11-24 | Disposition: A | Discharge status: Home / Self Care | Payer: Medicare Other | Source: Ambulatory Visit | Attending: Surgery | Admitting: Surgery

## 2019-11-24 DIAGNOSIS — R1084 Generalized abdominal pain: Secondary | ICD-10-CM

## 2019-12-21 ENCOUNTER — Other Ambulatory Visit: Payer: Self-pay | Admitting: Surgery

## 2021-11-12 IMAGING — US US ABDOMEN LIMITED
1 series · 14 of 25 positions shown · non-contrast
Comparison: None.

CLINICAL DATA: Initial evaluation for colicky abdominal pain.

EXAM:
ULTRASOUND ABDOMEN LIMITED RIGHT UPPER QUADRANT

[Series 1: us abdomen limited · 0.17mm/px · 14 of 39 slices shown]
[im 1/39]
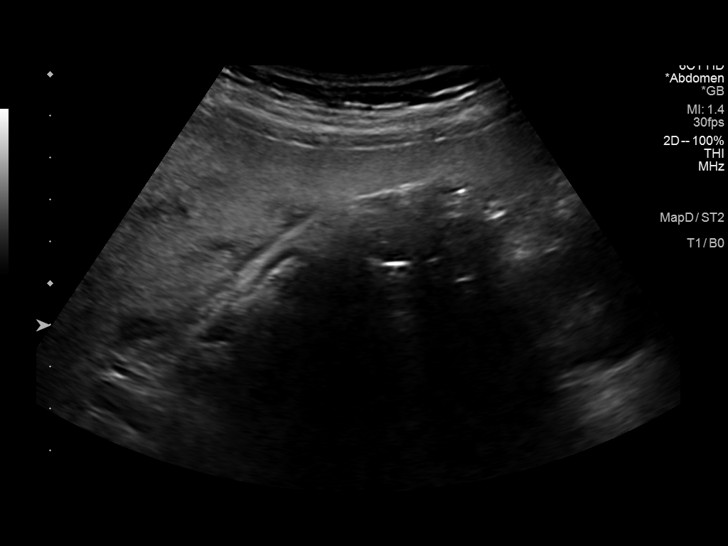
[im 4/39]
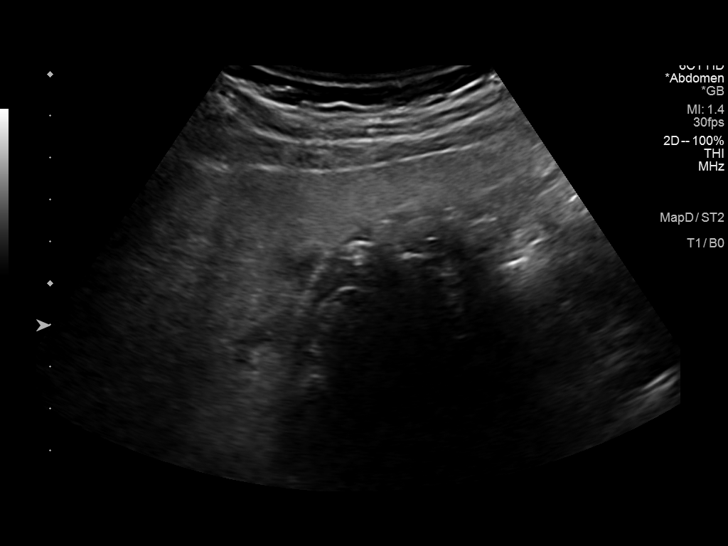
[im 7/39]
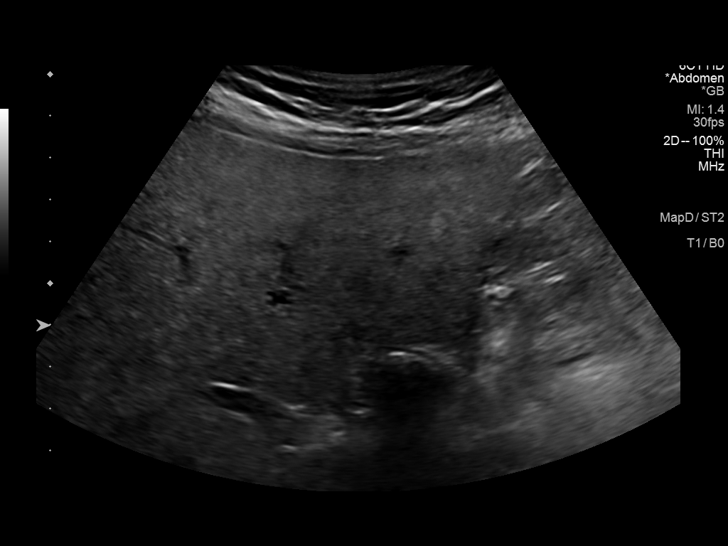
[im 10/39]
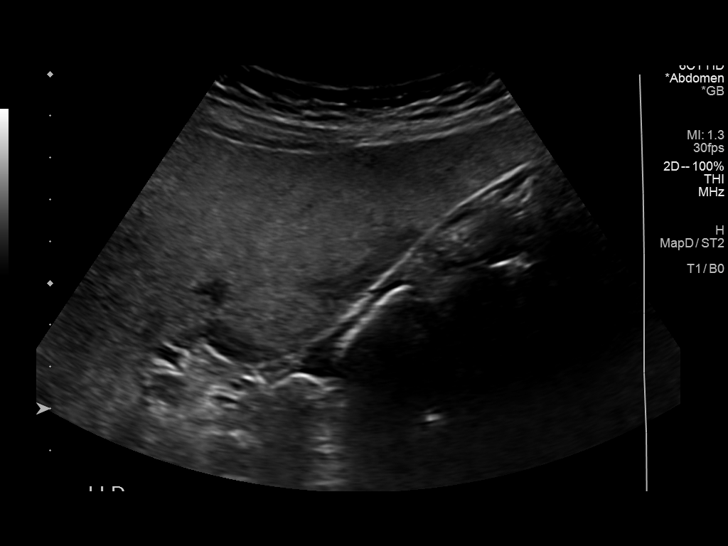
[im 13/39]
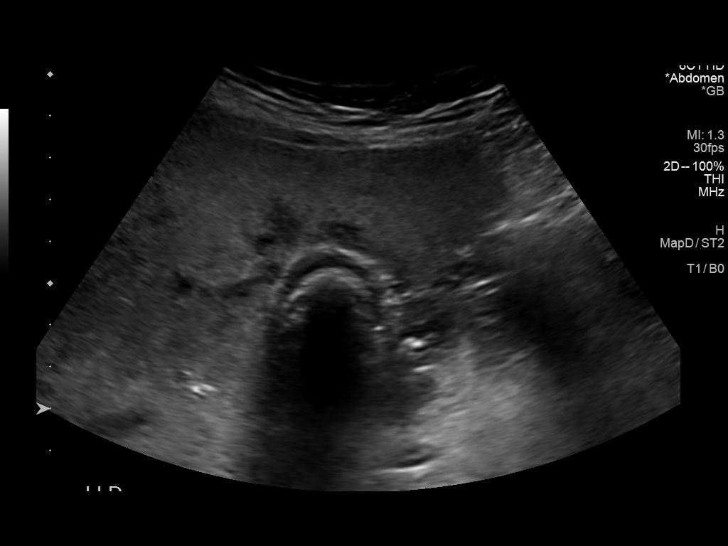
[im 15/39]
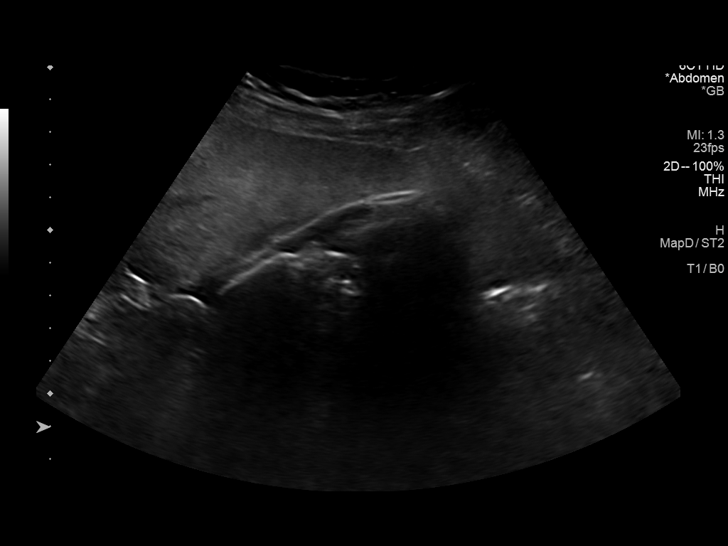
[im 18/39]
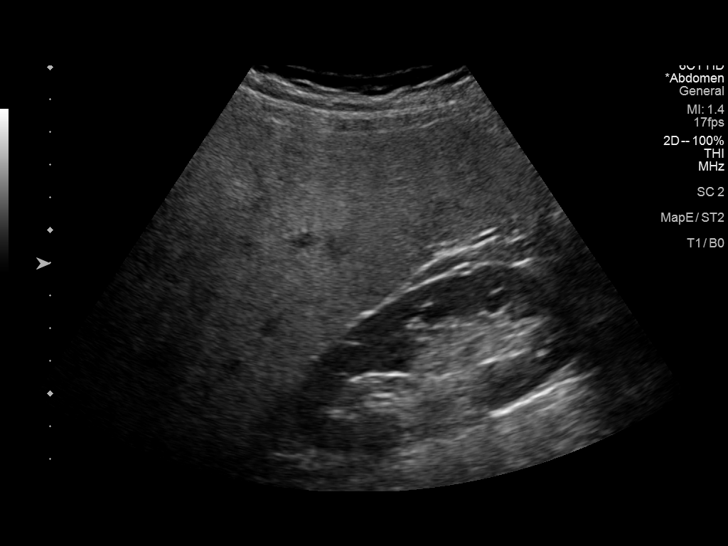
[im 21/39]
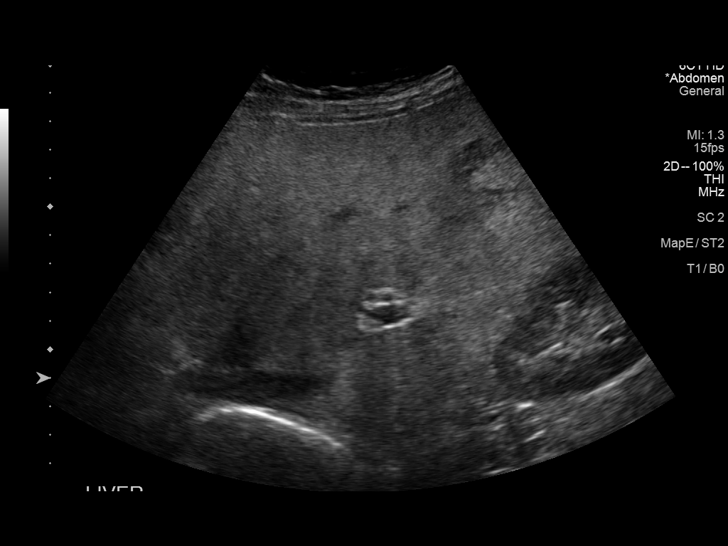
[im 24/39]
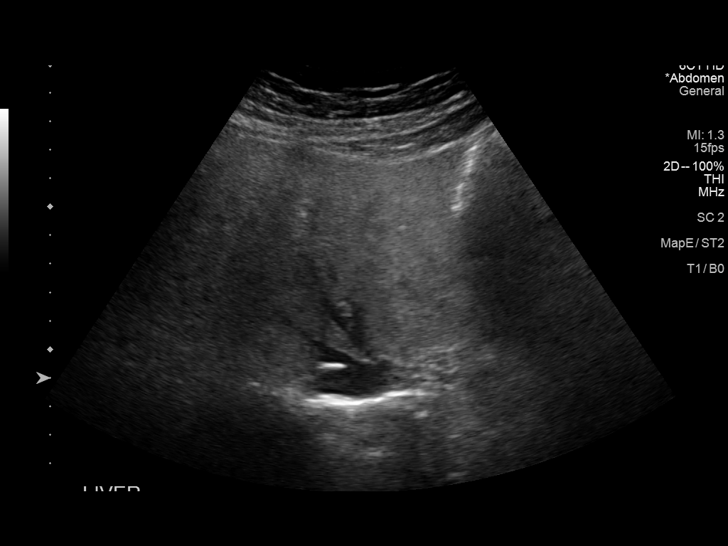
[im 26/39]
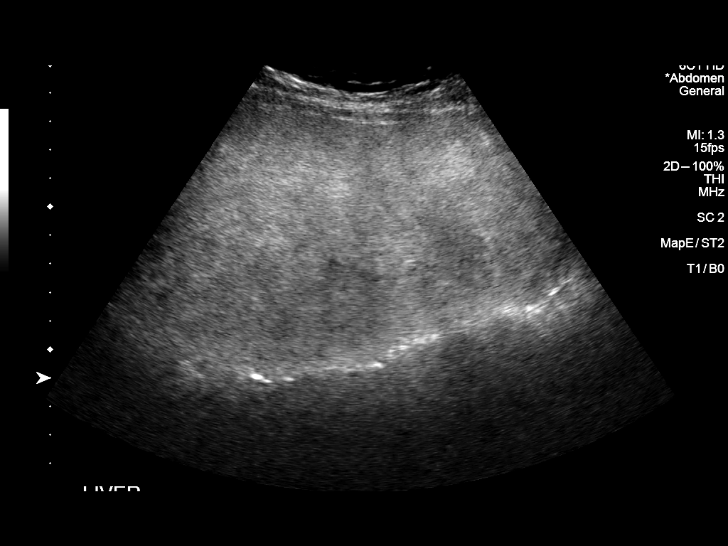
[im 29/39]
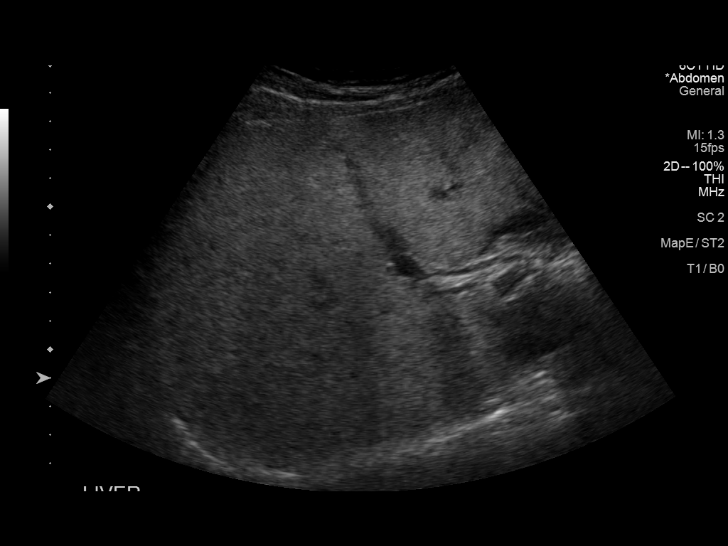
[im 32/39]
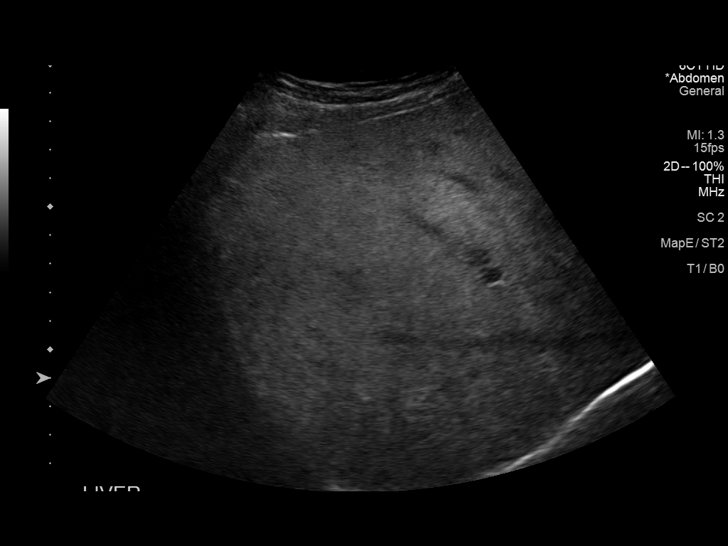
[im 35/39]
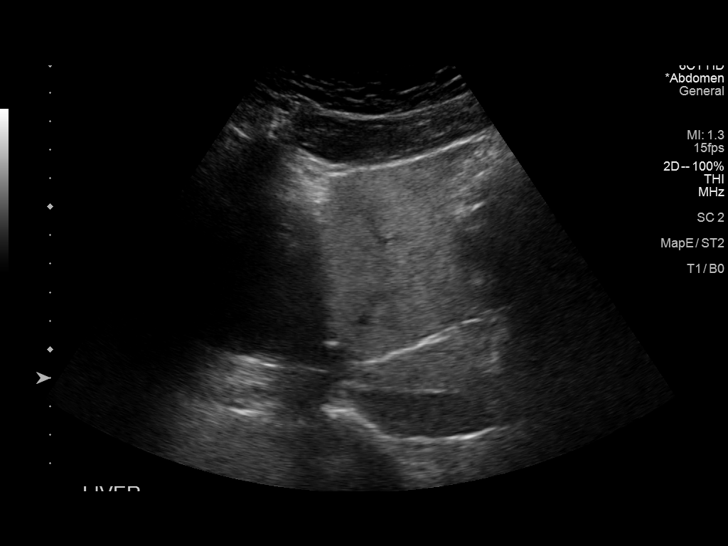
[im 39/39]
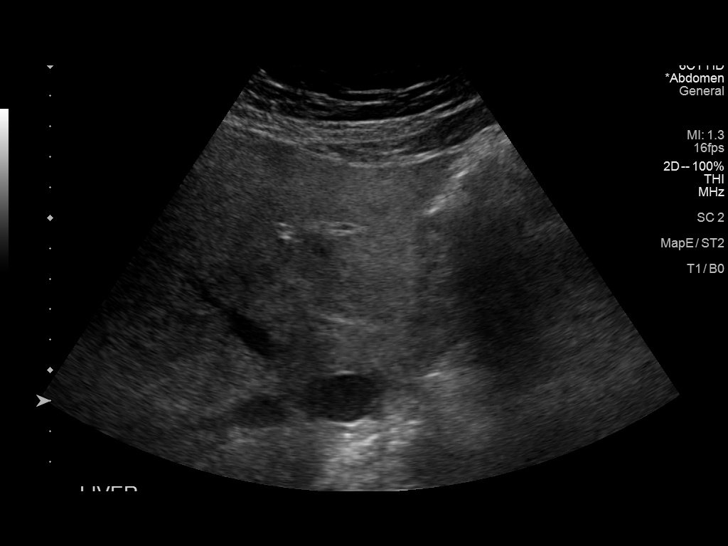

[14 of 25 positions shown; findings below may reference images not displayed]

FINDINGS: Gallbladder:

Multiple shadowing echogenic stones present within the gallbladder
lumen, largest of which measures 2.3 cm. No gallbladder wall
thickening or free pericholecystic fluid. No sonographic Murphy sign
elicited on exam.

Common bile duct:

Diameter: 3 mm

Liver:

No focal lesion identified. Liver demonstrates a coarse echogenic
echotexture. Portal vein is patent on color Doppler imaging with
normal direction of blood flow towards the liver.

Other: None.
IMPRESSION: 1. Cholelithiasis. No sonographic features to suggest acute
cholecystitis.
2. No biliary dilatation.
3. Coarse echogenic echotexture of the liver, suggesting fatty
infiltration.

## 2023-08-01 ENCOUNTER — Other Ambulatory Visit: Payer: Self-pay | Admitting: Urology

## 2023-08-01 DIAGNOSIS — R972 Elevated prostate specific antigen [PSA]: Secondary | ICD-10-CM

## 2023-10-20 ENCOUNTER — Other Ambulatory Visit: Payer: Self-pay | Admitting: Urology

## 2023-10-20 DIAGNOSIS — T1590XA Foreign body on external eye, part unspecified, unspecified eye, initial encounter: Secondary | ICD-10-CM

## 2023-10-20 DIAGNOSIS — R972 Elevated prostate specific antigen [PSA]: Secondary | ICD-10-CM

## 2023-10-23 ENCOUNTER — Other Ambulatory Visit: Payer: Self-pay | Admitting: Urology

## 2023-10-23 DIAGNOSIS — R972 Elevated prostate specific antigen [PSA]: Secondary | ICD-10-CM

## 2023-10-23 DIAGNOSIS — T1590XA Foreign body on external eye, part unspecified, unspecified eye, initial encounter: Secondary | ICD-10-CM

## 2023-11-14 ENCOUNTER — Encounter: Payer: Self-pay | Admitting: Urology

## 2023-11-19 ENCOUNTER — Ambulatory Visit
Admission: RE | Admit: 2023-11-19 | Discharge: 2023-11-19 | Disposition: A | Discharge status: Home / Self Care | Source: Ambulatory Visit | Attending: Urology | Admitting: Urology

## 2023-11-19 ENCOUNTER — Inpatient Hospital Stay
Admission: RE | Admit: 2023-11-19 | Discharge: 2023-11-19 | Disposition: A | Discharge status: Home / Self Care | Source: Ambulatory Visit | Attending: Urology

## 2023-11-19 DIAGNOSIS — R972 Elevated prostate specific antigen [PSA]: Secondary | ICD-10-CM

## 2023-11-19 DIAGNOSIS — T1590XA Foreign body on external eye, part unspecified, unspecified eye, initial encounter: Secondary | ICD-10-CM

## 2023-11-19 MED ORDER — GADOPICLENOL 0.5 MMOL/ML IV SOLN
7.5000 mL | Freq: Once | INTRAVENOUS | Status: AC | PRN
  Administered 2023-11-19: 7.5 mL via INTRAVENOUS

## 2024-01-28 ENCOUNTER — Other Ambulatory Visit: Payer: Self-pay | Admitting: Urology

## 2024-01-28 ENCOUNTER — Other Ambulatory Visit (HOSPITAL_COMMUNITY): Payer: Self-pay | Admitting: Urology

## 2024-01-28 DIAGNOSIS — R972 Elevated prostate specific antigen [PSA]: Secondary | ICD-10-CM

## 2024-02-02 NOTE — Patient Instructions (Signed)
 SURGICAL WAITING ROOM VISITATION  Patients having surgery or a procedure may have no more than 2 support people in the waiting area - these visitors may rotate.    Children under the age of 57 must have an adult with them who is not the patient.  Visitors with respiratory illnesses are discouraged from visiting and should remain at home.  If the patient needs to stay at the hospital during part of their recovery, the visitor guidelines for inpatient rooms apply. Pre-op nurse will coordinate an appropriate time for 1 support person to accompany patient in pre-op.  This support person may not rotate.    Please refer to the [facility] website for the visitor guidelines for Inpatients (after your surgery is over and you are in a regular room).       Your procedure is scheduled on: 02/04/24   Report to [facility] Main Entrance    Report to admitting at 12:35 PM   Call this number if you have problems the morning of surgery [phone]   Do not eat food or drink liquids :After Midnight.but may have sips of water with meds.    FOLLOW BOWEL PREP AND ANY ADDITIONAL PRE OP INSTRUCTIONS YOU RECEIVED FROM YOUR SURGEON'S OFFICE!!!  Fleet enema   Oral Hygiene is also important to reduce your risk of infection.                                    Remember - BRUSH YOUR TEETH THE MORNING OF SURGERY WITH YOUR REGULAR TOOTHPASTE  DENTURES WILL BE REMOVED PRIOR TO SURGERY PLEASE DO NOT APPLY Poly grip OR ADHESIVES!!!   Do NOT smoke after Midnight   Stop all vitamins and herbal supplements 7 days before surgery.   Take these medicines the morning of surgery with A SIP OF WATER: Alprazolam(xanax), nasal spray, oxycodone, pantoprazole.  DO NOT TAKE ANY ORAL DIABETIC MEDICATIONS DAY OF YOUR SURGERY              You may not have any metal on your body including hair pins, jewelry, and body piercing             Do not wear make-up, lotions, powders, perfumes/cologne, or deodorant               Men may shave face and neck.   Do not bring valuables to the hospital. [facility] IS NOT             RESPONSIBLE   FOR VALUABLES.   Contacts, glasses, dentures or bridgework may not be worn into surgery.    DO NOT BRING YOUR HOME MEDICATIONS TO THE HOSPITAL. PHARMACY WILL DISPENSE MEDICATIONS LISTED ON YOUR MEDICATION LIST TO YOU DURING YOUR ADMISSION IN THE HOSPITAL!    Patients discharged on the day of surgery will not be allowed to drive home.  Someone NEEDS to stay with you for the first 24 hours after anesthesia.   Special Instructions: Bring a copy of your healthcare power of attorney and living will documents the day of surgery if you haven't scanned them before.              Please read over the following fact sheets you were given: IF YOU HAVE QUESTIONS ABOUT YOUR PRE-OP INSTRUCTIONS PLEASE CALL [phone] [place].   If you received a COVID test during your pre-op visit  it is requested that you wear a mask  when out in public, stay away from anyone that may not be feeling well and notify your surgeon if you develop symptoms. If you test positive for Covid or have been in contact with anyone that has tested positive in the last 10 days please notify you surgeon.    [facility] - Preparing for Surgery Before surgery, you can play an important role.  Because skin is not sterile, your skin needs to be as free of germs as possible.  You can reduce the number of germs on your skin by washing with CHG (chlorahexidine gluconate) soap before surgery.  CHG is an antiseptic cleaner which kills germs and bonds with the skin to continue killing germs even after washing. Please DO NOT use if you have an allergy to CHG or antibacterial soaps.  If your skin becomes reddened/irritated stop using the CHG and inform your nurse when you arrive at Short Stay. Do not shave (including legs and underarms) for at least 48 hours prior to the first CHG shower.  You may shave your face/neck.  Please  follow these instructions carefully:  1.  Shower with CHG Soap the night before surgery and the morning of surgery.  2.  If you choose to wash your hair, wash your hair first as usual with your normal  shampoo.  3.  After you shampoo, rinse your hair and body thoroughly to remove the shampoo.                             4.  Use CHG as you would any other liquid soap.  You can apply chg directly to the skin and wash.  Gently with a scrungie or clean washcloth.  5.  Apply the CHG Soap to your body ONLY FROM THE NECK DOWN.   Do   not use on face/ open                           Wound or open sores. Avoid contact with eyes, ears mouth and   genitals (private parts).                       Wash face,  Genitals (private parts) with your normal soap.             6.  Wash thoroughly, paying special attention to the area where your    surgery  will be performed.  7.  Thoroughly rinse your body with warm water from the neck down.  8.  DO NOT shower/wash with your normal soap after using and rinsing off the CHG Soap.                9.  Pat yourself dry with a clean towel.            10.  Wear clean pajamas.            11.  Place clean sheets on your bed the night of your first shower and do not  sleep with pets. Day of Surgery : Do not apply any CHG, lotions/deodorants the morning of surgery.  Please wear clean clothes to the hospital/surgery center.  FAILURE TO FOLLOW THESE INSTRUCTIONS MAY RESULT IN THE CANCELLATION OF YOUR SURGERY  PATIENT SIGNATURE_________________________________  NURSE SIGNATURE__________________________________  ________________________________________________________________________

## 2024-02-02 NOTE — Progress Notes (Addendum)
 COVID Vaccine received:  [x]  No []  Yes Date of any COVID positive Test in last 90 days: no PCP - Garnette Ore MD Cardiologist - David Skipper in [place]  Chest x-ray -  EKG -  August 2025 Stress Test -  ECHO - 11/15/16 Epic Cardiac Cath - 12/22/17  Bowel Prep - [x]  No  []   Yes ______  Pacemaker / ICD device [x]  No []  Yes   Spinal Cord Stimulator:[x]  No []  Yes       History of Sleep Apnea? [x]  No []  Yes   CPAP used?- [x]  No []  Yes    Does the patient monitor blood sugar?          [x]  No []  Yes  []  N/A  Patient has: []  NO Hx DM   []  Pre-DM                 []  DM1  [x]   DM2 Does patient have a [employer] or Dexacom? []  No []  Yes   Fasting Blood Sugar Ranges- no Checks Blood Sugar ___0__ times a day  GLP1 agonist / usual dose - no GLP1 instructions:  SGLT-2 inhibitors / usual dose - no SGLT-2 instructions:   Blood Thinner / Instructions:no Aspirin Instructions:ASA 81 mg  01/30/24 last dose  Comments:   Activity level: Patient is able  to climb a flight of stairs without difficulty; [x]  No CP  [x]  No SOB,   Patient can perform ADLs without assistance.   Anesthesia review: HTN, DM, CAD, TAVR surgery  Patient denies shortness of breath, fever, cough and chest pain at PAT appointment.  Patient verbalized understanding and agreement to the Pre-Surgical Instructions that were given to them at this PAT appointment. Patient was also educated of the need to review these PAT instructions again prior to his/her surgery.I reviewed the appropriate phone numbers to call if they have any and questions or concerns.

## 2024-02-03 ENCOUNTER — Encounter (HOSPITAL_COMMUNITY)
Admission: RE | Admit: 2024-02-03 | Discharge: 2024-02-03 | Disposition: A | Discharge status: Home / Self Care | Source: Ambulatory Visit | Attending: Urology | Admitting: Urology

## 2024-02-03 ENCOUNTER — Encounter (HOSPITAL_COMMUNITY): Payer: Self-pay

## 2024-02-03 ENCOUNTER — Other Ambulatory Visit: Payer: Self-pay

## 2024-02-03 VITALS — Ht 67.0 in | Wt 163.0 lb

## 2024-02-03 DIAGNOSIS — Z01818 Encounter for other preprocedural examination: Secondary | ICD-10-CM

## 2024-02-03 DIAGNOSIS — I10 Essential (primary) hypertension: Secondary | ICD-10-CM

## 2024-02-03 DIAGNOSIS — E119 Type 2 diabetes mellitus without complications: Secondary | ICD-10-CM

## 2024-02-03 HISTORY — DX: Type 2 diabetes mellitus without complications: E11.9

## 2024-02-03 HISTORY — DX: Essential (primary) hypertension: I10

## 2024-02-03 HISTORY — DX: Other complications of anesthesia, initial encounter: T88.59XA

## 2024-02-03 HISTORY — DX: Depression, unspecified: F32.A

## 2024-02-03 HISTORY — DX: Cardiac murmur, unspecified: R01.1

## 2024-02-03 HISTORY — DX: Unspecified osteoarthritis, unspecified site: M19.90

## 2024-02-03 HISTORY — DX: Personal history of other diseases of the digestive system: Z87.19

## 2024-02-03 HISTORY — DX: Anxiety disorder, unspecified: F41.9

## 2024-02-03 HISTORY — DX: Myoneural disorder, unspecified: G70.9

## 2024-02-03 HISTORY — DX: Atherosclerotic heart disease of native coronary artery without angina pectoris: I25.10

## 2024-02-03 NOTE — Progress Notes (Signed)
 Anesthesia Chart Review   Case: [id number] Date/Time: 02/04/24 1434   Procedure: BIOPSY, PROSTATE, RECTAL APPROACH, WITH US  GUIDANCE - TRANSRECTAL ULTRASOUND WITH PROSTATE BIOPSY   Anesthesia type: Monitor Anesthesia Care   Diagnosis: Elevated PSA [R97.20]   Pre-op diagnosis: ELEVATED PSA   Location: [facility] ROOM 07 / [facility]   Surgeons: Devere Lonni Righter, MD       DISCUSSION:68 y.o. never smoker with h/o HTN, CAD, s/p TAVR 2019, DM II, elevated PSA scheduled for above procedure 02/04/2024 with Dr. Lonni Devere.   Per cardiology preoperative evaluation 01/29/2024, Patient is at elevated risk but from a cardiac standpoint is on optimal medical therapy. No indication for further evaluation at this time. His elevated risk is not prohibitive for necessary procedure.  I agree with continuing ASA interrupted if possible given his hx of TAVR and CAD. (Under media tab) Pt reports to PAT nurse can climb a flight of stairs without chest pain or shortness of breath.   I have requested cardio notes, pending.   Pt not seen in clinic, telephone call, SDW.  Labs DOS.  VS: Ht 5' 7 (1.702 m)   Wt 73.9 kg   BMI 25.53 kg/m   PROVIDERS: Nichole Senior, MD is PCP   [facility]  LABS: Labs DOS (all labs ordered are listed, but only abnormal results are displayed)  Labs Reviewed - No data to display   IMAGES:   EKG:   CV: Echo 11/15/2016 - Left ventricle: The cavity size was normal. There was mild    concentric hypertrophy. Systolic function was normal. The    estimated ejection fraction was in the range of 60% to 65%. Wall    motion was normal; there were no regional wall motion    abnormalities. Features are consistent with a pseudonormal left    ventricular filling pattern, with concomitant abnormal relaxation    and increased filling pressure (grade 2 diastolic dysfunction).  - Aortic valve: Valve mobility was restricted. There was severe    stenosis.  There was no regurgitation. Peak velocity (S): 440    cm/s. Mean gradient (S): 42 mm Hg.  - Mitral valve: Transvalvular velocity was within the normal range.    There was no evidence for stenosis. There was trivial    regurgitation.  - Left atrium: The atrium was mildly dilated.  - Right ventricle: The cavity size was normal. Wall thickness was    normal. Systolic function was normal.  - Atrial septum: No defect or patent foramen ovale was identified    by color flow Doppler.  - Tricuspid valve: There was trivial regurgitation.  - Pulmonary arteries: Systolic pressure was within the normal    range. PA peak pressure: 33 mm Hg (S).   Myocardial Perfusion 12/13/2015  The left ventricular ejection fraction is normal (55-65%). Nuclear stress EF: 56%. There was no ST segment deviation noted during stress. The study is normal. This is a low risk study.   Normal pharmacologic nuclear stress test with no evidence of prior infarct or ischemia.  Past Medical History:  Diagnosis Date   Anxiety    Arthritis    Complication of anesthesia    Coronary artery disease    Depression    Diabetes mellitus without complication (HCC)    Heart murmur    History of hiatal hernia    Hypertension    Neuromuscular disorder [facility])     Past Surgical History:  Procedure Laterality Date   ABDOMINAL HERNIA REPAIR  AORTIC VALVE REPAIR     2019   BONY PELVIS SURGERY     EYE SURGERY      MEDICATIONS:  ALPHA LIPOIC ACID PO   ALPRAZolam (XANAX) 0.25 MG tablet   Azelastine HCl 0.15 % SOLN   Cinnamon 500 MG capsule   diclofenac Sodium (VOLTAREN) 1 % GEL   dicyclomine (BENTYL) 10 MG capsule   diltiazem (TIAZAC) 360 MG 24 hr capsule   fluticasone (FLONASE) 50 MCG/ACT nasal spray   Gymnema Sylvestris Leaf POWD   naproxen sodium (ALEVE) 220 MG tablet   OVER THE COUNTER MEDICATION   oxyCODONE-acetaminophen (PERCOCET) 7.5-325 MG tablet   pantoprazole (PROTONIX) 40 MG tablet   PROAIR  HFA 108 (90 Base)  MCG/ACT inhaler   tiZANidine (ZANAFLEX) 4 MG tablet   No current facility-administered medications for this encounter.      Harlene Hoots Ward, PA-C [facility] Pre-Surgical Testing [phone]

## 2024-02-03 NOTE — Progress Notes (Signed)
 Reached pt. By phone to do PST interview. Pt. Identified himself by name and DOB. Pt. Answered all questions. Pre op instructions given. Pt and wife verbalized understanding. Wife said she was taking notes. The number to admitting given to pt. So he could do pre admit.

## 2024-02-04 ENCOUNTER — Encounter (HOSPITAL_COMMUNITY): Payer: Self-pay | Admitting: Urology

## 2024-02-04 ENCOUNTER — Ambulatory Visit (HOSPITAL_COMMUNITY)

## 2024-02-04 ENCOUNTER — Ambulatory Visit (HOSPITAL_COMMUNITY)
Admission: RE | Admit: 2024-02-04 | Discharge: 2024-02-04 | Disposition: A | Discharge status: Home / Self Care | Attending: Urology | Admitting: Urology

## 2024-02-04 ENCOUNTER — Encounter (HOSPITAL_COMMUNITY): Payer: Self-pay | Admitting: Physician Assistant

## 2024-02-04 ENCOUNTER — Encounter (HOSPITAL_COMMUNITY)
Admission: RE | Disposition: A | Discharge status: Home / Self Care | Payer: Self-pay | Source: Home / Self Care | Attending: Urology

## 2024-02-04 ENCOUNTER — Ambulatory Visit (HOSPITAL_COMMUNITY): Admitting: Anesthesiology

## 2024-02-04 DIAGNOSIS — I10 Essential (primary) hypertension: Secondary | ICD-10-CM | POA: Insufficient documentation

## 2024-02-04 DIAGNOSIS — Z539 Procedure and treatment not carried out, unspecified reason: Secondary | ICD-10-CM | POA: Insufficient documentation

## 2024-02-04 DIAGNOSIS — R972 Elevated prostate specific antigen [PSA]: Secondary | ICD-10-CM | POA: Insufficient documentation

## 2024-02-04 DIAGNOSIS — Z01818 Encounter for other preprocedural examination: Secondary | ICD-10-CM | POA: Diagnosis not present

## 2024-02-04 DIAGNOSIS — E119 Type 2 diabetes mellitus without complications: Secondary | ICD-10-CM | POA: Insufficient documentation

## 2024-02-04 LAB — CBC
HCT: 27.4 % — ABNORMAL LOW (ref 39.0–52.0)
Hemoglobin: 7.9 g/dL — ABNORMAL LOW (ref 13.0–17.0)
MCH: 20.6 pg — ABNORMAL LOW (ref 26.0–34.0)
MCHC: 28.8 g/dL — ABNORMAL LOW (ref 30.0–36.0)
MCV: 71.4 fL — ABNORMAL LOW (ref 80.0–100.0)
Platelets: 509 K/uL — ABNORMAL HIGH (ref 150–400)
RBC: 3.84 MIL/uL — ABNORMAL LOW (ref 4.22–5.81)
RDW: 14.9 % (ref 11.5–15.5)
WBC: 8 K/uL (ref 4.0–10.5)
nRBC: 0 % (ref 0.0–0.2)

## 2024-02-04 LAB — BASIC METABOLIC PANEL WITH GFR
Anion gap: 10 (ref 5–15)
BUN: 17 mg/dL (ref 8–23)
CO2: 24 mmol/L (ref 22–32)
Calcium: 9.7 mg/dL (ref 8.9–10.3)
Chloride: 105 mmol/L (ref 98–111)
Creatinine, Ser: 1.08 mg/dL (ref 0.61–1.24)
GFR, Estimated: >60 mL/min (ref >60)
Glucose, Bld: 139 mg/dL — ABNORMAL HIGH (ref 70–99)
Potassium: 4.3 mmol/L (ref 3.5–5.1)
Sodium: 139 mmol/L (ref 135–145)

## 2024-02-04 LAB — HEMOGLOBIN A1C
Hgb A1c MFr Bld: 6.7 % — ABNORMAL HIGH (ref 4.8–5.6)
Mean Plasma Glucose: 145.59 mg/dL

## 2024-02-04 LAB — GLUCOSE, CAPILLARY: Glucose-Capillary: 141 mg/dL — ABNORMAL HIGH (ref 70–99)

## 2024-02-04 SURGERY — BIOPSY, PROSTATE, RECTAL APPROACH, WITH US GUIDANCE
Anesthesia: Monitor Anesthesia Care

## 2024-02-04 MED ORDER — CHLORHEXIDINE GLUCONATE 0.12 % MT SOLN
15.0000 mL | Freq: Once | OROMUCOSAL | Status: AC
  Administered 2024-02-04: 15 mL via OROMUCOSAL

## 2024-02-04 MED ORDER — ORAL CARE MOUTH RINSE: 15.0000 mL | Freq: Once | OROMUCOSAL | Status: AC

## 2024-02-04 MED ORDER — LIDOCAINE HCL (PF) 2 % IJ SOLN
INTRAMUSCULAR | Status: AC
  Filled 2024-02-04: qty 5

## 2024-02-04 MED ORDER — PROPOFOL 10 MG/ML IV BOLUS
INTRAVENOUS | Status: AC
  Filled 2024-02-04: qty 20

## 2024-02-04 MED ORDER — MIDAZOLAM HCL (PF) 2 MG/2ML IJ SOLN
1.0000 mg | Freq: Once | INTRAMUSCULAR | Status: AC
  Administered 2024-02-04: 1 mg via INTRAVENOUS

## 2024-02-04 MED ORDER — PHENYLEPHRINE 80 MCG/ML (10ML) SYRINGE FOR IV PUSH (FOR BLOOD PRESSURE SUPPORT)
PREFILLED_SYRINGE | INTRAVENOUS | Status: AC
  Filled 2024-02-04: qty 10

## 2024-02-04 MED ORDER — INSULIN ASPART 100 UNIT/ML IJ SOLN: 0.0000 [IU] | INTRAMUSCULAR | Status: DC | PRN

## 2024-02-04 MED ORDER — SODIUM CHLORIDE 0.9 % IV SOLN: INTRAVENOUS | Status: DC

## 2024-02-04 MED ORDER — ACETAMINOPHEN 325 MG PO TABS
ORAL_TABLET | ORAL | Status: AC
  Filled 2024-02-04: qty 2

## 2024-02-04 MED ORDER — MIDAZOLAM HCL 2 MG/2ML IJ SOLN
INTRAMUSCULAR | Status: AC
  Filled 2024-02-04: qty 2

## 2024-02-04 MED ORDER — FLEET ENEMA RE ENEM: 1.0000 | ENEMA | Freq: Once | RECTAL | Status: DC

## 2024-02-04 MED ORDER — FENTANYL CITRATE (PF) 100 MCG/2ML IJ SOLN
INTRAMUSCULAR | Status: AC
  Filled 2024-02-04: qty 2

## 2024-02-04 MED ORDER — SODIUM CHLORIDE 0.9 % IV SOLN
2.0000 g | INTRAVENOUS | Status: DC
  Filled 2024-02-04: qty 20

## 2024-02-04 MED ORDER — ACETAMINOPHEN 500 MG PO TABS
1000.0000 mg | ORAL_TABLET | Freq: Once | ORAL | Status: DC
  Filled 2024-02-04: qty 2

## 2024-02-04 MED ORDER — ACETAMINOPHEN 325 MG PO TABS: 650.0000 mg | ORAL_TABLET | Freq: Once | ORAL | Status: DC

## 2024-02-04 MED ORDER — LACTATED RINGERS IV SOLN: INTRAVENOUS | Status: DC

## 2024-02-04 NOTE — OR Nursing (Signed)
 Winter MD on speaker phone explained to patient and wife at bedside procedure cancelled due to no US  tech available due procedure delayed from patient eating a cookie at 1000am this morning. Patient and wife aware procedure will be rescheduled by Winter MD's office.

## 2024-02-04 NOTE — Anesthesia Preprocedure Evaluation (Addendum)
 Anesthesia Evaluation    Airway        Dental   Pulmonary Current Smoker and Patient abstained from smoking.          Cardiovascular hypertension, Pt. on medications + Valvular Problems/Murmurs (s/p TAVR 2019)   Echo 01/16/2021 with well functioning bioprosthetic aortic valve.   Neuro/Psych   Anxiety Depression       GI/Hepatic ,GERD  Medicated,,  Endo/Other  diabetes    Renal/GU      Musculoskeletal  (+) Arthritis ,    Abdominal   Peds  Hematology Hb 7.9, plt 509k   Anesthesia Other Findings   Reproductive/Obstetrics                              Anesthesia Physical Anesthesia Plan  ASA: 3  Anesthesia Plan: General   Post-op Pain Management: Tylenol PO (pre-op)*   Induction: Intravenous  PONV Risk Score and Plan: 1 and Ondansetron and Dexamethasone  Airway Management Planned: LMA  Additional Equipment: None  Intra-op Plan:   Post-operative Plan:   Informed Consent:   Plan Discussed with:   Anesthesia Plan Comments: (See PAT note 02/03/2024)         Anesthesia Quick Evaluation

## 2024-02-04 NOTE — H&P (Signed)
 Urology Preoperative H&P   Chief Complaint: Elevated PSA   History of Present Illness: Jonathon Rojas is a 68 y.o. male with an elevated PSA   Last PSA:  5.3 (06/2023)  The patient is here today for a standard prostate biopsy to investigate his elevated PSA.  He was scheduled for an MRI fusion prostate biopsy in the office, but cannot lay on his left side for prolonged periods of time due to previous pelvic fracture.  His prostate MRI showed a 9 mm PI-RADS 4 lesion involving the posterior lateral mid base.  He denies interval UTIs, dysuria or hematuria.  Past Medical History:  Diagnosis Date   Anxiety    Arthritis    Complication of anesthesia    Coronary artery disease    Depression    Diabetes mellitus without complication (HCC)    Heart murmur    History of hiatal hernia    Hypertension    Neuromuscular disorder (HCC)     Past Surgical History:  Procedure Laterality Date   ABDOMINAL HERNIA REPAIR     AORTIC VALVE REPAIR     2019   BONY PELVIS SURGERY     EYE SURGERY      Allergies:  Allergies  Allergen Reactions   Ivp Dye [Iodinated Contrast Media] Anaphylaxis    Pt has EPI PEN for this allergy.   Shellfish Allergy Anaphylaxis    Pt has EPI PEN for this allergy.   Canagliflozin Swelling   Flexeril [Cyclobenzaprine] Hives   Ibuprofen Hives   Metformin And Related Swelling   Other Hives   Robaxin [Methocarbamol] Hives    No family history on file.  Social History:  reports that he has never smoked. He has never used smokeless tobacco. He reports current alcohol use of about 1.0 standard drink of alcohol per week. He reports that he does not use drugs.  ROS: A complete review of systems was performed.  All systems are negative except for pertinent findings as noted.  Physical Exam:  Vital signs in last 24 hours: Weight:  [73.9 kg] 73.9 kg (11/18 1404) Constitutional:  Alert and oriented, No acute distress Cardiovascular: Regular rate and rhythm, No  JVD Respiratory: Normal respiratory effort, Lungs clear bilaterally GI: Abdomen is soft, nontender, nondistended, no abdominal masses GU: No CVA tenderness Lymphatic: No lymphadenopathy Neurologic: Grossly intact, no focal deficits Psychiatric: Normal mood and affect  Laboratory Data:  No results for input(s): WBC, HGB, HCT, PLT in the last 72 hours.  No results for input(s): NA, K, CL, GLUCOSE, BUN, CALCIUM, CREATININE in the last 72 hours.  Invalid input(s): CO3   No results found for this or any previous visit (from the past 24 hours). No results found for this or any previous visit (from the past 240 hours).  Renal Function: No results for input(s): CREATININE in the last 168 hours. CrCl cannot be calculated (Patient's most recent lab result is older than the maximum 21 days allowed.).  Radiologic Imaging: No results found.  I independently reviewed the above imaging studies.  Assessment and Plan Jonathon Rojas is a 68 y.o. male with an elevated PSA  -The risk, benefits and alternatives to transrectal ultrasound-guided prostate biopsy was discussed with the patient.  He voices understanding and wishes to proceed.  Lonni Han, MD 02/04/2024, 11:50 AM  [facility] Pager: [phone]

## 2024-02-06 NOTE — Progress Notes (Signed)
 Sent message, via epic in basket, requesting orders in epic from Careers adviser.

## 2024-02-16 ENCOUNTER — Encounter (HOSPITAL_COMMUNITY): Payer: Self-pay | Admitting: Urology

## 2024-02-16 ENCOUNTER — Other Ambulatory Visit: Payer: Self-pay

## 2024-02-16 NOTE — Progress Notes (Addendum)
 Anesthesia Review:  PCP: Cardiologist :  PPM/ ICD: Device Orders: Rep Notified:  Chest x-ray : EKG : Echo : Stress test: Cardiac Cath :   Activity level:  Sleep Study/ CPAP : Fasting Blood Sugar :      / Checks Blood Sugar -- times a day:    Blood Thinner/ Instructions /Last Dose: ASA / Instructions/ Last Dose :    02/16/2024 1310pm.  Attempted to call pt and LVMM in regards to surgery .   PT first surgery cancelled due to the fact that he ate a cookie.   PT called back on 02/16/2024 at 1315pm.  Medical hx updated and preop instructons reviewed with pt.  ( Same as before except date and time) PT aware to arrive at 1030am on 02/18/2024.   PT states that on 02/14/24 he has had a cyst develop on back that is draining  pus' and he is keeping dressing over cyst .  Cyst has appeared before several months ago and improved but has since redevelped on 02/14/24.  PT instructted to call [facility] and let DR Devere be aware .  PT also has gallbladder issues and states he does not do any thing in am due to gallbladder issues he is currently having and goes to bathroom frequently at times.  Instructed pt to call [facility] at end of phone call and speak with Triage in regards to gallbladder issues to see if they can change time time later for him if possible and to let them be aware of cyst that is draining on back.  PT could not tell preop nurse size of cyst at time of phone call.     Time change on surgery.  PT called preop nurse on 02/17/2024.  PT aware to arrive at 1315pm on 02/18/2024.  PT stated that office had called him.  PT also states that he did make DR Devere office aware of cyst on back.

## 2024-02-17 NOTE — Anesthesia Preprocedure Evaluation (Signed)
 Anesthesia Evaluation  Patient identified by MRN, date of birth, ID band Patient awake    Reviewed: Allergy & Precautions, NPO status , Patient's Chart, lab work & pertinent test results, reviewed documented beta blocker date and time   History of Anesthesia Complications (+) history of anesthetic complications  Airway Mallampati: II  TM Distance: >3 FB     Dental no notable dental hx.    Pulmonary neg COPD, Current Smoker and Patient abstained from smoking.   breath sounds clear to auscultation       Cardiovascular hypertension, + CAD  (-) Past MI and (-) Cardiac Stents + Valvular Problems/Murmurs  Rhythm:Regular Rate:Normal  (Pre TAVR)  Left ventricle: The cavity size was normal. There was mild    concentric hypertrophy. Systolic function was normal. The    estimated ejection fraction was in the range of 60% to 65%. Wall    motion was normal; there were no regional wall motion    abnormalities. Features are consistent with a pseudonormal left    ventricular filling pattern, with concomitant abnormal relaxation    and increased filling pressure (grade 2 diastolic dysfunction).  - Aortic valve: Valve mobility was restricted. There was severe    stenosis. There was no regurgitation. Peak velocity (S): 440    cm/s. Mean gradient (S): 42 mm Hg.  - Mitral valve: Transvalvular velocity was within the normal range.    There was no evidence for stenosis. There was trivial    regurgitation.  - Left atrium: The atrium was mildly dilated.  - Right ventricle: The cavity size was normal. Wall thickness was    normal. Systolic function was normal.  - Atrial septum: No defect or patent foramen ovale was identified    by color flow Doppler.  - Tricuspid valve: There was trivial regurgitation.  - Pulmonary arteries: Systolic pressure was within the normal    range. PA peak pressure: 33 mm Hg (S).      Neuro/Psych neg Seizures  PSYCHIATRIC DISORDERS Anxiety Depression     Neuromuscular disease    GI/Hepatic hiatal hernia,,,(+) neg Cirrhosis        Endo/Other  diabetes, Type 2    Renal/GU Renal disease     Musculoskeletal  (+) Arthritis , Osteoarthritis,    Abdominal   Peds  Hematology   Anesthesia Other Findings   Reproductive/Obstetrics                              Anesthesia Physical Anesthesia Plan  ASA: 2  Anesthesia Plan: MAC   Post-op Pain Management:    Induction: Intravenous  PONV Risk Score and Plan: 1 and Ondansetron and Dexamethasone  Airway Management Planned: Natural Airway and Simple Face Mask  Additional Equipment:   Intra-op Plan:   Post-operative Plan:   Informed Consent: I have reviewed the patients History and Physical, chart, labs and discussed the procedure including the risks, benefits and alternatives for the proposed anesthesia with the patient or authorized representative who has indicated his/her understanding and acceptance.     Dental advisory given  Plan Discussed with: CRNA  Anesthesia Plan Comments: (See PAT note from 11/18)         Anesthesia Quick Evaluation

## 2024-02-18 ENCOUNTER — Ambulatory Visit (HOSPITAL_COMMUNITY): Admission: RE | Admit: 2024-02-18 | Discharge: 2024-02-18 | Disposition: A | Attending: Urology | Admitting: Urology

## 2024-02-18 ENCOUNTER — Ambulatory Visit (HOSPITAL_COMMUNITY): Payer: Self-pay | Admitting: Medical

## 2024-02-18 ENCOUNTER — Encounter (HOSPITAL_COMMUNITY)
Admission: RE | Disposition: A | Discharge status: Home / Self Care | Payer: Self-pay | Source: Home / Self Care | Attending: Urology

## 2024-02-18 ENCOUNTER — Encounter (HOSPITAL_COMMUNITY): Payer: Self-pay | Admitting: Urology

## 2024-02-18 ENCOUNTER — Other Ambulatory Visit: Payer: Self-pay

## 2024-02-18 ENCOUNTER — Ambulatory Visit (HOSPITAL_COMMUNITY)
Admission: RE | Admit: 2024-02-18 | Discharge: 2024-02-18 | Disposition: A | Source: Ambulatory Visit | Attending: Urology

## 2024-02-18 ENCOUNTER — Other Ambulatory Visit: Payer: Self-pay | Admitting: Urology

## 2024-02-18 DIAGNOSIS — F17200 Nicotine dependence, unspecified, uncomplicated: Secondary | ICD-10-CM

## 2024-02-18 DIAGNOSIS — I2510 Atherosclerotic heart disease of native coronary artery without angina pectoris: Secondary | ICD-10-CM | POA: Diagnosis not present

## 2024-02-18 DIAGNOSIS — R972 Elevated prostate specific antigen [PSA]: Secondary | ICD-10-CM | POA: Diagnosis not present

## 2024-02-18 DIAGNOSIS — I10 Essential (primary) hypertension: Secondary | ICD-10-CM

## 2024-02-18 HISTORY — PX: PROSTATE BIOPSY: SHX241

## 2024-02-18 LAB — GLUCOSE, CAPILLARY: Glucose-Capillary: 152 mg/dL — ABNORMAL HIGH (ref 70–99)

## 2024-02-18 SURGERY — BIOPSY, PROSTATE, RECTAL APPROACH, WITH US GUIDANCE
Anesthesia: Monitor Anesthesia Care

## 2024-02-18 MED ORDER — EPHEDRINE 5 MG/ML INJ
INTRAVENOUS | Status: AC
  Filled 2024-02-18: qty 5

## 2024-02-18 MED ORDER — CHLORHEXIDINE GLUCONATE 0.12 % MT SOLN
15.0000 mL | Freq: Once | OROMUCOSAL | Status: AC
  Administered 2024-02-18: 15 mL via OROMUCOSAL

## 2024-02-18 MED ORDER — FLEET ENEMA RE ENEM: 1.0000 | ENEMA | Freq: Once | RECTAL | Status: DC

## 2024-02-18 MED ORDER — EPHEDRINE SULFATE (PRESSORS) 25 MG/5ML IV SOSY
PREFILLED_SYRINGE | INTRAVENOUS | Status: DC | PRN
  Administered 2024-02-18: 10 mg via INTRAVENOUS

## 2024-02-18 MED ORDER — LIDOCAINE HCL (PF) 2 % IJ SOLN
INTRAMUSCULAR | Status: DC | PRN
  Administered 2024-02-18: 100 mg via INTRADERMAL

## 2024-02-18 MED ORDER — LACTATED RINGERS IV SOLN: INTRAVENOUS | Status: DC

## 2024-02-18 MED ORDER — PROPOFOL 10 MG/ML IV BOLUS
INTRAVENOUS | Status: DC | PRN
  Administered 2024-02-18: 10 mg via INTRAVENOUS
  Administered 2024-02-18: 20 mg via INTRAVENOUS
  Administered 2024-02-18: 10 mg via INTRAVENOUS

## 2024-02-18 MED ORDER — PROPOFOL 10 MG/ML IV BOLUS
INTRAVENOUS | Status: AC
  Filled 2024-02-18: qty 20

## 2024-02-18 MED ORDER — INSULIN ASPART 100 UNIT/ML IJ SOLN: 0.0000 [IU] | INTRAMUSCULAR | Status: DC | PRN

## 2024-02-18 MED ORDER — SODIUM CHLORIDE 0.9 % IV SOLN
2.0000 g | INTRAVENOUS | Status: AC
  Administered 2024-02-18: 2 g via INTRAVENOUS
  Filled 2024-02-18: qty 20

## 2024-02-18 MED ORDER — PROPOFOL 500 MG/50ML IV EMUL
INTRAVENOUS | Status: DC | PRN
  Administered 2024-02-18: 150 ug/kg/min via INTRAVENOUS

## 2024-02-18 SURGICAL SUPPLY — 4 items
INST BIOPSY MAXCORE 18GX25 (NEEDLE) IMPLANT
INSTR BIOPSY MAXCORE 18GX20 (NEEDLE) IMPLANT
KIT TURNOVER KIT A (KITS) ×1 IMPLANT
UNDERPAD 30X36 HEAVY ABSORB (UNDERPADS AND DIAPERS) ×2 IMPLANT

## 2024-02-18 NOTE — Op Note (Signed)
 Operative Note  Preoperative diagnosis:  1.  Elevated PSA   Postoperative diagnosis: 1.  Elevated PSA  Procedure(s): 1.  TRUS guided prostate biopsy   Surgeon: Lonni Han, MD  Assistants:  None  Anesthesia:  MAC  Complications:  None  EBL: Less than 5 mL  Specimens: 1.  Prostate biopsy specimens  Drains/Catheters: 1.  None  Intraoperative findings:   Prostate volume: 30.4 cm No hypoechoic lesions were seen throughout the prostate parenchyma  Indication:  Jonathon Rojas is a 68 y.o. male with an elevated PSA value.  He is here today for a transrectal ultrasound-guided prostate biopsy.  He has been consented for the above procedures, voices understanding and wishes to proceed.  Description of procedure:  After informed consent was obtained, the patient was brought to the operating room and MAC anesthesia was administered.  The patient was placed in the right lateral decubitus position and prepped in usual fashion.  A timeout was then performed.  A transrectal ultrasound probe was then inserted into the rectum and the prostate was visualized.  Prostate volume was found to be 30.4 cm with no hypoechoic lesions.  Core needle biopsies were then taken from each sextant of the prostate and sent to pathology for permanent section.  The patient tolerated the procedure well and was transferred to the postanesthesia in stable condition.  Plan: Follow-up in 2 weeks to discuss pathology results

## 2024-02-18 NOTE — Transfer of Care (Signed)
 Immediate Anesthesia Transfer of Care Note  Patient: Jonathon Rojas  Procedure(s) Performed: BIOPSY, PROSTATE, RECTAL APPROACH, WITH US  GUIDANCE  Patient Location: PACU  Anesthesia Type:General  Level of Consciousness: awake and alert   Airway & Oxygen Therapy: Patient Spontanous Breathing and Patient connected to face mask oxygen  Post-op Assessment: Report given to RN and Post -op Vital signs reviewed and stable  Post vital signs: Reviewed and stable  Last Vitals:  Vitals Value Taken Time  BP 132/61 02/18/24 17:25  Temp    Pulse 56 02/18/24 17:25  Resp 10 02/18/24 17:25  SpO2 98 % 02/18/24 17:25  Vitals shown include unfiled device data.  Last Pain:  Vitals:   02/18/24 1410  TempSrc: Oral  PainSc: 5          Complications: No notable events documented.

## 2024-02-18 NOTE — H&P (Signed)
 Urology Preoperative H&P   Chief Complaint: Elevated PSA   History of Present Illness: Karry Causer is a 68 y.o. male with an elevated PSA    Last PSA:  5.3 (06/2023)   The patient is here today for a standard prostate biopsy to investigate his elevated PSA.  He was scheduled for an MRI fusion prostate biopsy in the office, but cannot lay on his left side for prolonged periods of time due to previous pelvic fracture.  His prostate MRI showed a 9 mm PI-RADS 4 lesion involving the posterior lateral mid base.  He denies interval UTIs, dysuria or hematuria.   Past Medical History:  Diagnosis Date   Anxiety    Arthritis    Complication of anesthesia    Coronary artery disease    Depression    Diabetes mellitus without complication (HCC)    Heart murmur    History of hiatal hernia    Hypertension    Neuromuscular disorder (HCC)     Past Surgical History:  Procedure Laterality Date   ABDOMINAL HERNIA REPAIR     AORTIC VALVE REPAIR     2019   BONY PELVIS SURGERY     EYE SURGERY      Allergies:  Allergies  Allergen Reactions   Ivp Dye [Iodinated Contrast Media] Anaphylaxis    Pt has EPI PEN for this allergy.   Shellfish Allergy Anaphylaxis    Pt has EPI PEN for this allergy.   Canagliflozin Swelling   Flexeril [Cyclobenzaprine] Hives   Ibuprofen Hives   Metformin And Related Swelling   Other Hives    Invocana = Makes BP high, hives   Robaxin [Methocarbamol] Hives    History reviewed. No pertinent family history.  Social History:  reports that he has been smoking cigarettes. He has been exposed to tobacco smoke. He has never used smokeless tobacco. He reports current alcohol use of about 1.0 standard drink of alcohol per week. He reports that he does not use drugs.  ROS: A complete review of systems was performed.  All systems are negative except for pertinent findings as noted.  Physical Exam:  Vital signs in last 24 hours:   Constitutional:  Alert and oriented, No  acute distress Cardiovascular: Regular rate and rhythm, No JVD Respiratory: Normal respiratory effort, Lungs clear bilaterally GI: Abdomen is soft, nontender, nondistended, no abdominal masses GU: No CVA tenderness Lymphatic: No lymphadenopathy Neurologic: Grossly intact, no focal deficits Psychiatric: Normal mood and affect  Laboratory Data:  No results for input(s): WBC, HGB, HCT, PLT in the last 72 hours.  No results for input(s): NA, K, CL, GLUCOSE, BUN, CALCIUM, CREATININE in the last 72 hours.  Invalid input(s): CO3   No results found for this or any previous visit (from the past 24 hours). No results found for this or any previous visit (from the past 240 hours).  Renal Function: No results for input(s): CREATININE in the last 168 hours. Estimated Creatinine Clearance: 60.4 mL/min (by C-G formula based on SCr of 1.08 mg/dL).  Radiologic Imaging: No results found.  I independently reviewed the above imaging studies.  Assessment and Plan Telvin Reinders is a 68 y.o. male with an elevated PSA    -The risk, benefits and alternatives to transrectal ultrasound-guided prostate biopsy was discussed with the patient.  He voices understanding and wishes to proceed.   Lonni Han, MD 02/18/2024, 8:29 AM  [facility] Pager: [phone]

## 2024-02-19 ENCOUNTER — Encounter (HOSPITAL_COMMUNITY): Payer: Self-pay | Admitting: Urology

## 2024-02-19 NOTE — Anesthesia Postprocedure Evaluation (Signed)
 Anesthesia Post Note  Patient: Jonathon Rojas  Procedure(s) Performed: BIOPSY, PROSTATE, RECTAL APPROACH, WITH US  GUIDANCE     Patient location during evaluation: PACU Anesthesia Type: MAC Level of consciousness: awake and alert Pain management: pain level controlled Vital Signs Assessment: post-procedure vital signs reviewed and stable Respiratory status: spontaneous breathing, nonlabored ventilation and respiratory function stable Cardiovascular status: blood pressure returned to baseline Postop Assessment: no apparent nausea or vomiting Anesthetic complications: no   No notable events documented.  Last Vitals:  Vitals:   02/18/24 1754 02/18/24 1808  BP: (!) 142/59 129/66  Pulse:  62  Resp: 14 16  Temp: (!) 36.4 C (!) 36.4 C  SpO2: 99% 100%    Last Pain:  Vitals:   02/18/24 1808  TempSrc:   PainSc: 0-No pain                 Vertell Row

## 2024-02-20 LAB — SURGICAL PATHOLOGY

## 2024-03-19 ENCOUNTER — Observation Stay (HOSPITAL_COMMUNITY)
Admission: EM | Admit: 2024-03-19 | Discharge: 2024-03-20 | Disposition: A | Discharge status: Home / Self Care | Source: Home / Self Care | Attending: Emergency Medicine | Admitting: Emergency Medicine

## 2024-03-19 ENCOUNTER — Other Ambulatory Visit: Payer: Self-pay

## 2024-03-19 ENCOUNTER — Encounter (HOSPITAL_COMMUNITY): Payer: Self-pay

## 2024-03-19 ENCOUNTER — Emergency Department (HOSPITAL_COMMUNITY)

## 2024-03-19 DIAGNOSIS — I350 Nonrheumatic aortic (valve) stenosis: Secondary | ICD-10-CM | POA: Diagnosis not present

## 2024-03-19 DIAGNOSIS — D649 Anemia, unspecified: Secondary | ICD-10-CM | POA: Diagnosis not present

## 2024-03-19 DIAGNOSIS — F419 Anxiety disorder, unspecified: Secondary | ICD-10-CM | POA: Diagnosis not present

## 2024-03-19 DIAGNOSIS — E876 Hypokalemia: Secondary | ICD-10-CM | POA: Insufficient documentation

## 2024-03-19 DIAGNOSIS — I10 Essential (primary) hypertension: Secondary | ICD-10-CM | POA: Diagnosis present

## 2024-03-19 DIAGNOSIS — I5032 Chronic diastolic (congestive) heart failure: Secondary | ICD-10-CM | POA: Diagnosis not present

## 2024-03-19 DIAGNOSIS — E1165 Type 2 diabetes mellitus with hyperglycemia: Secondary | ICD-10-CM

## 2024-03-19 DIAGNOSIS — Z79899 Other long term (current) drug therapy: Secondary | ICD-10-CM | POA: Diagnosis not present

## 2024-03-19 DIAGNOSIS — E119 Type 2 diabetes mellitus without complications: Secondary | ICD-10-CM | POA: Diagnosis not present

## 2024-03-19 DIAGNOSIS — R0609 Other forms of dyspnea: Principal | ICD-10-CM

## 2024-03-19 DIAGNOSIS — I110 Hypertensive heart disease with heart failure: Secondary | ICD-10-CM | POA: Insufficient documentation

## 2024-03-19 DIAGNOSIS — F17210 Nicotine dependence, cigarettes, uncomplicated: Secondary | ICD-10-CM | POA: Diagnosis not present

## 2024-03-19 DIAGNOSIS — R0602 Shortness of breath: Secondary | ICD-10-CM | POA: Diagnosis present

## 2024-03-19 DIAGNOSIS — I2510 Atherosclerotic heart disease of native coronary artery without angina pectoris: Secondary | ICD-10-CM | POA: Insufficient documentation

## 2024-03-19 DIAGNOSIS — R531 Weakness: Secondary | ICD-10-CM

## 2024-03-19 DIAGNOSIS — G894 Chronic pain syndrome: Secondary | ICD-10-CM | POA: Insufficient documentation

## 2024-03-19 LAB — ABO/RH: ABO/RH(D): A POS

## 2024-03-19 LAB — URINALYSIS, ROUTINE W REFLEX MICROSCOPIC
Bacteria, UA: NONE SEEN
Bilirubin Urine: NEGATIVE
Glucose, UA: NEGATIVE mg/dL
Hgb urine dipstick: NEGATIVE
Ketones, ur: 5 mg/dL — AB
Nitrite: NEGATIVE
Protein, ur: NEGATIVE mg/dL
Specific Gravity, Urine: 1.005 (ref 1.005–1.030)
pH: 9 — ABNORMAL HIGH (ref 5.0–8.0)

## 2024-03-19 LAB — CBC
HCT: 23.2 % — ABNORMAL LOW (ref 39.0–52.0)
Hemoglobin: 6.4 g/dL — CL (ref 13.0–17.0)
MCH: 18.3 pg — ABNORMAL LOW (ref 26.0–34.0)
MCHC: 27.6 g/dL — ABNORMAL LOW (ref 30.0–36.0)
MCV: 66.3 fL — ABNORMAL LOW (ref 80.0–100.0)
Platelets: 406 K/uL — ABNORMAL HIGH (ref 150–400)
RBC: 3.5 MIL/uL — ABNORMAL LOW (ref 4.22–5.81)
RDW: 16.3 % — ABNORMAL HIGH (ref 11.5–15.5)
WBC: 11.1 K/uL — ABNORMAL HIGH (ref 4.0–10.5)
nRBC: 0 % (ref 0.0–0.2)

## 2024-03-19 LAB — RETICULOCYTES
Immature Retic Fract: 24.7 % — ABNORMAL HIGH (ref 2.3–15.9)
RBC.: 3.54 MIL/uL — ABNORMAL LOW (ref 4.22–5.81)
Retic Count, Absolute: 41.8 K/uL (ref 19.0–186.0)
Retic Ct Pct: 1.2 % (ref 0.4–3.1)

## 2024-03-19 LAB — COMPREHENSIVE METABOLIC PANEL WITH GFR
ALT: 25 U/L (ref 0–44)
AST: 32 U/L (ref 15–41)
Albumin: 4.5 g/dL (ref 3.5–5.0)
Alkaline Phosphatase: 104 U/L (ref 38–126)
Anion gap: 11 (ref 5–15)
BUN: 10 mg/dL (ref 8–23)
CO2: 25 mmol/L (ref 22–32)
Calcium: 9.4 mg/dL (ref 8.9–10.3)
Chloride: 102 mmol/L (ref 98–111)
Creatinine, Ser: 0.83 mg/dL (ref 0.61–1.24)
GFR, Estimated: >60 mL/min
Glucose, Bld: 206 mg/dL — ABNORMAL HIGH (ref 70–99)
Potassium: 3.3 mmol/L — ABNORMAL LOW (ref 3.5–5.1)
Sodium: 138 mmol/L (ref 135–145)
Total Bilirubin: 0.3 mg/dL (ref 0.0–1.2)
Total Protein: 7.2 g/dL (ref 6.5–8.1)

## 2024-03-19 LAB — RESP PANEL BY RT-PCR (RSV, FLU A&B, COVID)  RVPGX2
Influenza A by PCR: NEGATIVE
Influenza B by PCR: NEGATIVE
Resp Syncytial Virus by PCR: NEGATIVE
SARS Coronavirus 2 by RT PCR: NEGATIVE

## 2024-03-19 LAB — IRON AND TIBC
Iron: <10 ug/dL — ABNORMAL LOW (ref 45–182)
TIBC: 487 ug/dL — ABNORMAL HIGH (ref 250–450)

## 2024-03-19 LAB — VITAMIN B12: Vitamin B-12: 2417 pg/mL — ABNORMAL HIGH (ref 180–914)

## 2024-03-19 LAB — FERRITIN: Ferritin: 6 ng/mL — ABNORMAL LOW (ref 24–336)

## 2024-03-19 LAB — GLUCOSE, CAPILLARY
Glucose-Capillary: 147 mg/dL — ABNORMAL HIGH (ref 70–99)
Glucose-Capillary: 122 mg/dL — ABNORMAL HIGH (ref 70–99)

## 2024-03-19 LAB — HEMOGLOBIN AND HEMATOCRIT, BLOOD
HCT: 30.8 % — ABNORMAL LOW (ref 39.0–52.0)
Hemoglobin: 9.3 g/dL — ABNORMAL LOW (ref 13.0–17.0)

## 2024-03-19 LAB — FOLATE: Folate: >20 ng/mL

## 2024-03-19 LAB — TROPONIN T, HIGH SENSITIVITY
Troponin T High Sensitivity: 51 ng/L — ABNORMAL HIGH (ref 0–19)
Troponin T High Sensitivity: 59 ng/L — ABNORMAL HIGH (ref 0–19)

## 2024-03-19 LAB — PREPARE RBC (CROSSMATCH)

## 2024-03-19 LAB — LIPASE, BLOOD: Lipase: 20 U/L (ref 11–51)

## 2024-03-19 LAB — PRO BRAIN NATRIURETIC PEPTIDE: Pro Brain Natriuretic Peptide: 828 pg/mL — ABNORMAL HIGH

## 2024-03-19 MED ORDER — ACETAMINOPHEN 650 MG RE SUPP: 650.0000 mg | Freq: Four times a day (QID) | RECTAL | Status: DC | PRN

## 2024-03-19 MED ORDER — OXYBUTYNIN CHLORIDE ER 10 MG PO TB24
10.0000 mg | ORAL_TABLET | Freq: Every day | ORAL | Status: DC
  Administered 2024-03-20: 10 mg via ORAL
  Filled 2024-03-19: qty 1

## 2024-03-19 MED ORDER — ALPRAZOLAM 0.25 MG PO TABS
0.2500 mg | ORAL_TABLET | Freq: Two times a day (BID) | ORAL | Status: DC | PRN
  Administered 2024-03-19 – 2024-03-20 (×3): 0.25 mg via ORAL
  Filled 2024-03-19 (×3): qty 1

## 2024-03-19 MED ORDER — ALBUTEROL SULFATE (2.5 MG/3ML) 0.083% IN NEBU: 2.5000 mg | INHALATION_SOLUTION | RESPIRATORY_TRACT | Status: DC | PRN

## 2024-03-19 MED ORDER — INSULIN ASPART 100 UNIT/ML IJ SOLN
0.0000 [IU] | Freq: Three times a day (TID) | INTRAMUSCULAR | Status: DC
  Administered 2024-03-19: 2 [IU] via SUBCUTANEOUS
  Administered 2024-03-20: 3 [IU] via SUBCUTANEOUS
  Administered 2024-03-20: 2 [IU] via SUBCUTANEOUS
  Filled 2024-03-19: qty 2
  Filled 2024-03-19: qty 3
  Filled 2024-03-19: qty 2

## 2024-03-19 MED ORDER — POTASSIUM CHLORIDE CRYS ER 20 MEQ PO TBCR
40.0000 meq | EXTENDED_RELEASE_TABLET | Freq: Once | ORAL | Status: AC
  Administered 2024-03-19: 40 meq via ORAL
  Filled 2024-03-19: qty 2

## 2024-03-19 MED ORDER — ONDANSETRON HCL 4 MG/2ML IJ SOLN: 4.0000 mg | Freq: Four times a day (QID) | INTRAMUSCULAR | Status: DC | PRN

## 2024-03-19 MED ORDER — ONDANSETRON HCL 4 MG PO TABS: 4.0000 mg | ORAL_TABLET | Freq: Four times a day (QID) | ORAL | Status: DC | PRN

## 2024-03-19 MED ORDER — LORAZEPAM 2 MG/ML IJ SOLN
1.0000 mg | Freq: Once | INTRAMUSCULAR | Status: AC
  Administered 2024-03-19: 1 mg via INTRAVENOUS
  Filled 2024-03-19: qty 1

## 2024-03-19 MED ORDER — TRAZODONE HCL 50 MG PO TABS
50.0000 mg | ORAL_TABLET | Freq: Every evening | ORAL | Status: DC | PRN
  Administered 2024-03-19: 50 mg via ORAL
  Filled 2024-03-19: qty 1

## 2024-03-19 MED ORDER — FUROSEMIDE 10 MG/ML IJ SOLN
20.0000 mg | Freq: Once | INTRAMUSCULAR | Status: AC
  Administered 2024-03-19: 20 mg via INTRAVENOUS
  Filled 2024-03-19: qty 4

## 2024-03-19 MED ORDER — NICOTINE 21 MG/24HR TD PT24: 21.0000 mg | MEDICATED_PATCH | Freq: Every day | TRANSDERMAL | Status: DC

## 2024-03-19 MED ORDER — SODIUM CHLORIDE 0.9% IV SOLUTION: Freq: Once | INTRAVENOUS | Status: DC

## 2024-03-19 MED ORDER — INSULIN ASPART 100 UNIT/ML IJ SOLN: 0.0000 [IU] | Freq: Every day | INTRAMUSCULAR | Status: DC

## 2024-03-19 MED ORDER — IPRATROPIUM BROMIDE 0.02 % IN SOLN
0.5000 mg | Freq: Once | RESPIRATORY_TRACT | Status: AC
  Administered 2024-03-19: 0.5 mg via RESPIRATORY_TRACT
  Filled 2024-03-19: qty 2.5

## 2024-03-19 MED ORDER — OXYCODONE-ACETAMINOPHEN 5-325 MG PO TABS
1.0000 | ORAL_TABLET | Freq: Four times a day (QID) | ORAL | Status: DC | PRN
  Administered 2024-03-19 – 2024-03-20 (×3): 1 via ORAL
  Filled 2024-03-19 (×3): qty 1

## 2024-03-19 MED ORDER — ACETAMINOPHEN 325 MG PO TABS: 650.0000 mg | ORAL_TABLET | Freq: Four times a day (QID) | ORAL | Status: DC | PRN

## 2024-03-19 MED ORDER — PANTOPRAZOLE SODIUM 40 MG PO TBEC
40.0000 mg | DELAYED_RELEASE_TABLET | Freq: Two times a day (BID) | ORAL | Status: DC
  Administered 2024-03-19 – 2024-03-20 (×2): 40 mg via ORAL
  Filled 2024-03-19 (×2): qty 1

## 2024-03-19 MED ORDER — ALBUTEROL SULFATE (2.5 MG/3ML) 0.083% IN NEBU
5.0000 mg | INHALATION_SOLUTION | Freq: Once | RESPIRATORY_TRACT | Status: AC
  Administered 2024-03-19: 5 mg via RESPIRATORY_TRACT
  Filled 2024-03-19: qty 6

## 2024-03-19 MED ORDER — MORPHINE SULFATE (PF) 2 MG/ML IV SOLN
2.0000 mg | Freq: Once | INTRAVENOUS | Status: AC
  Administered 2024-03-19: 2 mg via INTRAVENOUS
  Filled 2024-03-19: qty 1

## 2024-03-19 NOTE — Plan of Care (Signed)

## 2024-03-19 NOTE — ED Provider Notes (Signed)
 " [place] EMERGENCY DEPARTMENT AT [facility] Provider Note   CSN: [id number] Arrival date & time: 03/19/24  9348     Patient presents with: Shortness of Breath   Jonathon Rojas is a 69 y.o. male.   Pt c/o feeling generally weak, and has felt sob in past day. Pt indicates recently diagnosed with prostate ca so urinates q 30 minutes, which interferes with sleep at night. Also indicates has 'gallbladder sludge' and that intermittently he has abdominal pain and nausea - denies acute worsening of abdominal pain this AM, no emesis today. Occasional non prod cough. Indicates last time he felt this way he had pneumonia. Denies chest pain. Hx AS/TAVR.  Denies leg pain or swelling. No hx dvt or pe. No dysuria. No extremity pain/swelling. No orthopnea. No fever or chills. EMS noted o2 sats 90% and gave albuterol  treatment for wheezing. +smoker.   The history is provided by the patient, medical records and the EMS personnel.  Shortness of Breath Associated symptoms: abdominal pain and cough   Associated symptoms: no chest pain, no fever, no headaches, no neck pain, no rash and no sore throat        Prior to Admission medications  Medication Sig Start Date End Date Taking? Authorizing Provider  ALPHA LIPOIC ACID PO Take 1 tablet by mouth daily.    [provider]  ALPRAZolam (XANAX) 0.25 MG tablet Take 1 tablet by mouth 2 (two) times daily as needed for anxiety.  06/28/15   [provider]  Azelastine HCl 0.15 % SOLN Place 2 sprays into the nose 2 (two) times daily. 08/29/15   [provider]  Cinnamon 500 MG capsule Take 500 mg by mouth 2 (two) times daily.    [provider]  diclofenac Sodium (VOLTAREN) 1 % GEL Apply 4 g topically 4 (four) times daily.    [provider]  dicyclomine (BENTYL) 10 MG capsule Take 20 mg by mouth 2 (two) times daily. 10/13/23   [provider]  diltiazem (TIAZAC) 360 MG 24 hr capsule Take 1 capsule by  mouth every evening.  07/24/15   [provider]  fluticasone (FLONASE) 50 MCG/ACT nasal spray Place 2 sprays into both nostrils 2 (two) times daily.  12/15/16   [provider]  Gymnema Sylvestris Leaf POWD Take 2 Bags by mouth at bedtime. 2 tea bags, prepared    [provider]  naproxen sodium (ALEVE) 220 MG tablet Take 220 mg by mouth daily as needed.    [provider]  OVER THE COUNTER MEDICATION Take 1 tablet by mouth in the morning and at bedtime. Glucocil Combination Supplement    [provider]  oxyCODONE-acetaminophen  (PERCOCET) 7.5-325 MG tablet Take 1 tablet by mouth 4 (four) times daily. 08/23/15   [provider]  pantoprazole (PROTONIX) 40 MG tablet Take 1 tablet by mouth 2 (two) times daily. 07/07/15   [provider]  PROAIR  HFA 108 (90 Base) MCG/ACT inhaler Inhale 2 puffs into the lungs every 6 (six) hours as needed for wheezing or shortness of breath.  12/25/16   [provider]  tiZANidine (ZANAFLEX) 4 MG tablet Take 4 mg by mouth 3 (three) times daily.    [provider]    Allergies: Ivp dye [iodinated contrast media], Shellfish allergy, Canagliflozin, Flexeril [cyclobenzaprine], Ibuprofen, Metformin and related, Other, and Robaxin [methocarbamol]    Review of Systems  Constitutional:  Negative for fever.  HENT:  Negative for rhinorrhea, sinus pain and sore  throat.   Eyes:  Negative for redness.  Respiratory:  Positive for cough and shortness of breath.   Cardiovascular:  Negative for chest pain, palpitations and leg swelling.  Gastrointestinal:  Positive for abdominal pain and nausea.  Genitourinary:  Positive for frequency. Negative for dysuria and flank pain.  Musculoskeletal:  Negative for back pain and neck pain.  Skin:  Negative for rash.  Neurological:  Negative for speech difficulty, numbness and headaches.    Updated Vital Signs BP 139/66   Pulse 86   Temp 97.9 F (36.6 C) (Oral)    Resp 17   Ht 1.702 m (5' 7)   Wt 73.9 kg   SpO2 94%   BMI 25.52 kg/m   Physical Exam Vitals and nursing note reviewed.  Constitutional:      Appearance: Normal appearance. He is well-developed.  HENT:     Head: Atraumatic.     Nose: Nose normal.     Mouth/Throat:     Mouth: Mucous membranes are moist.     Pharynx: Oropharynx is clear.  Eyes:     General: No scleral icterus.    Pupils: Pupils are equal, round, and reactive to light.     Comments: Conj pale.   Neck:     Trachea: No tracheal deviation.  Cardiovascular:     Rate and Rhythm: Normal rate and regular rhythm.     Pulses: Normal pulses.     Heart sounds: Normal heart sounds. No murmur heard.    No friction rub. No gallop.  Pulmonary:     Effort: Pulmonary effort is normal. No accessory muscle usage or respiratory distress.     Breath sounds: Normal breath sounds.  Abdominal:     General: Bowel sounds are normal. There is no distension.     Palpations: Abdomen is soft.     Tenderness: There is no abdominal tenderness. There is no guarding.  Genitourinary:    Comments: No cva tenderness.  (Pt refuses rectal exam - discussed drop in hemoglobin, importance of knowing whether ongoing blood loss in stool, etc. - pt continues to adamantly refuse rectal exam).  Musculoskeletal:        General: No swelling or tenderness.     Cervical back: Normal range of motion and neck supple. No rigidity.     Right lower leg: No edema.     Left lower leg: No edema.  Skin:    General: Skin is warm and dry.     Coloration: Skin is pale.     Findings: No rash.  Neurological:     Mental Status: He is alert.     Comments: Alert, speech clear. Motor/sens grossly intact bil.   Psychiatric:        Mood and Affect: Mood normal.     (all labs ordered are listed, but only abnormal results are displayed) Results for orders placed or performed during the hospital encounter of 03/19/24  Resp panel by RT-PCR (RSV, Flu A&B, Covid)  Anterior Nasal Swab   Collection Time: 03/19/24  7:21 AM   Specimen: Anterior Nasal Swab  Result Value Ref Range   SARS Coronavirus 2 by RT PCR NEGATIVE NEGATIVE   Influenza A by PCR NEGATIVE NEGATIVE   Influenza B by PCR NEGATIVE NEGATIVE   Resp Syncytial Virus by PCR NEGATIVE NEGATIVE  Comprehensive metabolic panel   Collection Time: 03/19/24  8:13 AM  Result Value Ref Range   Sodium 138 135 - 145 mmol/L   Potassium 3.3 (L) 3.5 -  5.1 mmol/L   Chloride 102 98 - 111 mmol/L   CO2 25 22 - 32 mmol/L   Glucose, Bld 206 (H) 70 - 99 mg/dL   BUN 10 8 - 23 mg/dL   Creatinine, Ser 9.16 0.61 - 1.24 mg/dL   Calcium 9.4 8.9 - 89.6 mg/dL   Total Protein 7.2 6.5 - 8.1 g/dL   Albumin 4.5 3.5 - 5.0 g/dL   AST 32 15 - 41 U/L   ALT 25 0 - 44 U/L   Alkaline Phosphatase 104 38 - 126 U/L   Total Bilirubin 0.3 0.0 - 1.2 mg/dL   GFR, Estimated >39 >39 mL/min   Anion gap 11 5 - 15  CBC   Collection Time: 03/19/24  8:13 AM  Result Value Ref Range   WBC 11.1 (H) 4.0 - 10.5 K/uL   RBC 3.50 (L) 4.22 - 5.81 MIL/uL   Hemoglobin 6.4 (LL) 13.0 - 17.0 g/dL   HCT 76.7 (L) 60.9 - 47.9 %   MCV 66.3 (L) 80.0 - 100.0 fL   MCH 18.3 (L) 26.0 - 34.0 pg   MCHC 27.6 (L) 30.0 - 36.0 g/dL   RDW 83.6 (H) 88.4 - 84.4 %   Platelets 406 (H) 150 - 400 K/uL   nRBC 0.0 0.0 - 0.2 %  Lipase, blood   Collection Time: 03/19/24  8:13 AM  Result Value Ref Range   Lipase 20 11 - 51 U/L  Pro Brain natriuretic peptide   Collection Time: 03/19/24  8:13 AM  Result Value Ref Range   Pro Brain Natriuretic Peptide 828.0 (H) <300.0 pg/mL  Troponin T, High Sensitivity   Collection Time: 03/19/24  8:13 AM  Result Value Ref Range   Troponin T High Sensitivity 51 (H) 0 - 19 ng/L  Urinalysis, Routine w reflex microscopic -Urine, Clean Catch   Collection Time: 03/19/24  8:34 AM  Result Value Ref Range   Color, Urine YELLOW YELLOW   APPearance HAZY (A) CLEAR   Specific Gravity, Urine 1.005 1.005 - 1.030   pH 9.0 (H) 5.0 - 8.0    Glucose, UA NEGATIVE NEGATIVE mg/dL   Hgb urine dipstick NEGATIVE NEGATIVE   Bilirubin Urine NEGATIVE NEGATIVE   Ketones, ur 5 (A) NEGATIVE mg/dL   Protein, ur NEGATIVE NEGATIVE mg/dL   Nitrite NEGATIVE NEGATIVE   Leukocytes,Ua SMALL (A) NEGATIVE   RBC / HPF 0-5 0 - 5 RBC/hpf   WBC, UA 6-10 0 - 5 WBC/hpf   Bacteria, UA NONE SEEN NONE SEEN   Squamous Epithelial / HPF 0-5 0 - 5 /HPF   Amorphous Crystal PRESENT   Reticulocytes   Collection Time: 03/19/24  9:40 AM  Result Value Ref Range   Retic Ct Pct 1.2 0.4 - 3.1 %   RBC. 3.54 (L) 4.22 - 5.81 MIL/uL   Retic Count, Absolute 41.8 19.0 - 186.0 K/uL   Immature Retic Fract 24.7 (H) 2.3 - 15.9 %   DG Chest Port 1 View Result Date: 03/19/2024 CLINICAL DATA:  Shortness of breath.  Chest pain.  Generalized pain. EXAM: PORTABLE CHEST - 1 VIEW COMPARISON:  12/26/2016 FINDINGS: Cardiomediastinal silhouette and pulmonary vasculature are within normal limits. Linear opacity seen along the periphery of the lower lobes bilaterally suspicious for early interstitial pulmonary edema. Lungs are otherwise clear. IMPRESSION: Findings suspicious for early interstitial pulmonary edema. Electronically Signed   By: Aliene Lloyd M.D.   On: 03/19/2024 07:39   US  PROSTATE BIOPSY MULTIPLE Result Date: 02/18/2024 Please see Notes tab for  imaging impression.  US  Transrectal Complete Result Date: 02/18/2024 Please see Notes tab for imaging impression.  US  Guided Needle Placement Result Date: 02/18/2024 CLINICAL DATA:  Ultrasound was provided for use by the ordering physician.  No provider Interpretation or professional fees incurred.    US  Intraoperative Result Date: 02/18/2024 CLINICAL DATA:  Ultrasound was provided for use by the ordering physician.  No provider Interpretation or professional fees incurred.     EKG: EKG Interpretation Date/Time:  Friday March 19 2024 07:14:09 EST Ventricular Rate:  84 PR Interval:  226 QRS Duration:  86 QT  Interval:  440 QTC Calculation: 521 R Axis:   80  Text Interpretation: Sinus rhythm Prolonged PR interval Nonspecific ST abnormality Confirmed by Bernard Drivers ([id number]) on 03/19/2024 7:47:50 AM  Radiology: [facility] Chest Port 1 View Result Date: 03/19/2024 CLINICAL DATA:  Shortness of breath.  Chest pain.  Generalized pain. EXAM: PORTABLE CHEST - 1 VIEW COMPARISON:  12/26/2016 FINDINGS: Cardiomediastinal silhouette and pulmonary vasculature are within normal limits. Linear opacity seen along the periphery of the lower lobes bilaterally suspicious for early interstitial pulmonary edema. Lungs are otherwise clear. IMPRESSION: Findings suspicious for early interstitial pulmonary edema. Electronically Signed   By: Aliene Lloyd M.D.   On: 03/19/2024 07:39     Procedures   Medications Ordered in the ED  0.9 %  sodium chloride  infusion (Manually program via Guardrails IV Fluids) (has no administration in time range)                                    Medical Decision Making Problems Addressed: Dyspnea on exertion: acute illness or injury with systemic symptoms that poses a threat to life or bodily functions Generalized weakness: acute illness or injury that poses a threat to life or bodily functions Symptomatic anemia: acute illness or injury with systemic symptoms that poses a threat to life or bodily functions  Amount and/or Complexity of Data Reviewed Independent Historian: EMS    Details: hx External Data Reviewed: notes. Labs: ordered. Decision-making details documented in ED Course. Radiology: ordered and independent interpretation performed. Decision-making details documented in ED Course. ECG/medicine tests: ordered and independent interpretation performed. Decision-making details documented in ED Course. Discussion of management or test interpretation with external provider(s): medicine  Risk Prescription drug management. Decision regarding hospitalization.   Iv ns. Continuous pulse  ox and cardiac monitoring. Labs ordered/sent. Imaging ordered.   Differential diagnosis includes pna, covid, flu, etc. Dispo decision including potential need for admission considered - will get labs and imaging and reassess.   Reviewed nursing notes and prior charts for additional history. External reports reviewed. Additional history from: EMS.   Cardiac monitor: sinus rhythm, rate 80.  Labs reviewed/interpreted by me - wbc 11, hgb 6. Hgb decreased as compared to prior chronic anemia.  Pt notes some rectal bleeding after prior prostate biopsy last month, but says that since has resolved, denies melena. Pt continues to refuse rectal exam.  Type and screen added, transfuse prbcs.  Bnp and trop mildly elevated - pt denies any current chest pain or discomfort. Covid and flu neg. Chem w mildly low k and mildly high glucose but otherwise normal.   Xrays reviewed/interpreted by me - ?mild vascular congestion. No pna.   Hospitalists consulted for admission.  CRITICAL CARE RE: symptomatic/severe anemia w DOE, requiring emergent prbc transfusion Performed by: Revel Stellmach E Tylik Treese Total critical care time: 45 minutes Critical care  time was exclusive of separately billable procedures and treating other patients. Critical care was necessary to treat or prevent imminent or life-threatening deterioration. Critical care was time spent personally by me on the following activities: development of treatment plan with patient and/or surrogate as well as nursing, discussions with consultants, evaluation of patient's response to treatment, examination of patient, obtaining history from patient or surrogate, ordering and performing treatments and interventions, ordering and review of laboratory studies, ordering and review of radiographic studies, pulse oximetry and re-evaluation of patient's condition.       Final diagnoses:  None    ED Discharge Orders     None          Bernard Drivers, MD 03/19/24  [phone]  "

## 2024-03-19 NOTE — ED Triage Notes (Signed)
 Pt came in via EMS from home w/ c/o of SOB upon waking. Endorses chest pain. C/o of generalized pain.   4L on Secor at 90% Albuteral tx on EMS

## 2024-03-19 NOTE — H&P (Addendum)
 " History and Physical  Jonathon Rojas FMW:[id number] DOB: [birth year] DOA: 03/19/2024  PCP: Nichole Senior, MD   Chief Complaint: Shortness of breath, weakness  HPI: Jonathon Rojas is a 69 y.o. male with medical history significant for anxiety/depression, diet-controlled diabetes, hypertension, heart failure with preserved EF admitted to the hospital with dyspnea shortness of breath and symptomatic anemia.  History provided by the patient as well as his wife who is at the bedside, they state that for the past several days he has had progressive weakness, in the last 24 hours he has had worsening severe shortness of breath, wife states that he looked pale and gray this morning when EMS came to get him.  When he feels very short of breath, he also has some burning chest discomfort but this has resolved.  He denies any prior history of GI bleeding, but he recently had prostate biopsy in the middle of November, after which she said that he had the expected 3 to 5 days of blood in his urine and stools but this has completely resolved per the patient.  She denies any melanotic stools, any abdominal pain, nausea or vomiting.  He takes a baby aspirin daily, but no other blood thinners.  He denies any prior history of GI bleeding, states that he has had multiple EGDs and colonoscopies in the past.  Review of Systems: Please see HPI for pertinent positives and negatives. A complete 10 system review of systems are otherwise negative.  Past Medical History:  Diagnosis Date   Anxiety    Arthritis    Complication of anesthesia    Coronary artery disease    Depression    Diabetes mellitus without complication (HCC)    Heart murmur    History of hiatal hernia    Hypertension    Neuromuscular disorder [facility])    Past Surgical History:  Procedure Laterality Date   ABDOMINAL HERNIA REPAIR     AORTIC VALVE REPAIR     2019   BONY PELVIS SURGERY     EYE SURGERY     PROSTATE BIOPSY N/A 02/18/2024   Procedure: BIOPSY,  PROSTATE, RECTAL APPROACH, WITH US  GUIDANCE;  Surgeon: Devere Lonni Righter, MD;  Location: [facility];  Service: Urology;  Laterality: N/A;  TRANSRECTAL ULTRASOUND WITH PROSTATE BIOPSY   Social History:  reports that he has been smoking cigarettes. He has been exposed to tobacco smoke. He has never used smokeless tobacco. He reports current alcohol use of about 1.0 standard drink of alcohol per week. He reports that he does not use drugs.  Allergies[1]  History reviewed. No pertinent family history.   Prior to Admission medications  Medication Sig Start Date End Date Taking? Authorizing Provider  Azelastine HCl 137 MCG/SPRAY SOLN Place 2 sprays into both nostrils 2 (two) times daily. 02/13/24  Yes [provider]  oxybutynin (DITROPAN-XL) 10 MG 24 hr tablet Take 10 mg by mouth daily. 03/01/24  Yes [provider]  zolpidem (AMBIEN) 10 MG tablet Take 10 mg by mouth at bedtime as needed for sleep. 07/26/13  Yes [provider]  ALPHA LIPOIC ACID PO Take 1 tablet by mouth daily.    [provider]  ALPRAZolam (XANAX) 0.25 MG tablet Take 1 tablet by mouth 2 (two) times daily as needed for anxiety.  06/28/15   [provider]  Azelastine HCl 0.15 % SOLN Place 2 sprays into the nose 2 (two) times daily. 08/29/15   [provider]  Cinnamon 500 MG capsule Take 500  mg by mouth 2 (two) times daily.    [provider]  diclofenac Sodium (VOLTAREN) 1 % GEL Apply 4 g topically 4 (four) times daily.    [provider]  dicyclomine (BENTYL) 10 MG capsule Take 20 mg by mouth 2 (two) times daily. 10/13/23   [provider]  diltiazem (TIAZAC) 360 MG 24 hr capsule Take 1 capsule by mouth every evening.  07/24/15   [provider]  fluticasone (FLONASE) 50 MCG/ACT nasal spray Place 2 sprays into both nostrils 2 (two) times daily.  12/15/16   [provider]  Gymnema Sylvestris Leaf POWD Take 2 Bags by mouth at bedtime. 2  tea bags, prepared    [provider]  naproxen sodium (ALEVE) 220 MG tablet Take 220 mg by mouth daily as needed.    [provider]  OVER THE COUNTER MEDICATION Take 1 tablet by mouth in the morning and at bedtime. Glucocil Combination Supplement    [provider]  oxyCODONE-acetaminophen  (PERCOCET) 7.5-325 MG tablet Take 1 tablet by mouth 4 (four) times daily. 08/23/15   [provider]  pantoprazole (PROTONIX) 40 MG tablet Take 1 tablet by mouth 2 (two) times daily. 07/07/15   [provider]  PROAIR  HFA 108 (90 Base) MCG/ACT inhaler Inhale 2 puffs into the lungs every 6 (six) hours as needed for wheezing or shortness of breath.  12/25/16   [provider]  tiZANidine (ZANAFLEX) 4 MG tablet Take 4 mg by mouth 3 (three) times daily.    [provider]    Physical Exam: BP (!) 140/76   Pulse (!) 105   Temp 98 F (36.7 C) (Oral)   Resp (!) 23   Ht 5' 7 (1.702 m)   Wt 73.9 kg   SpO2 98%   BMI 25.52 kg/m  General:  Alert, oriented, calm, in no acute distress, slightly anxious in appearance.  His wife is at the bedside, he looks pale.  Wife states that he looks much improved. Cardiovascular: RRR, no murmurs or rubs, no peripheral edema  Respiratory: clear to auscultation bilaterally, no wheezes, no crackles  Abdomen: soft, nontender, nondistended, normal bowel tones heard  Skin: dry, no rashes  Musculoskeletal: no joint effusions, normal range of motion  Psychiatric: appropriate affect, normal speech  Neurologic: extraocular muscles intact, clear speech, moving all extremities with intact sensorium         Labs on Admission:  Basic Metabolic Panel: Recent Labs  Lab 03/19/24 0813  NA 138  K 3.3*  CL 102  CO2 25  GLUCOSE 206*  BUN 10  CREATININE 0.83  CALCIUM 9.4   Liver Function Tests: Recent Labs  Lab 03/19/24 0813  AST 32  ALT 25  ALKPHOS 104  BILITOT 0.3  PROT 7.2  ALBUMIN 4.5   Recent Labs  Lab  03/19/24 0813  LIPASE 20   No results for input(s): AMMONIA in the last 168 hours. CBC: Recent Labs  Lab 03/19/24 0813  WBC 11.1*  HGB 6.4*  HCT 23.2*  MCV 66.3*  PLT 406*   Cardiac Enzymes: No results for input(s): CKTOTAL, CKMB, CKMBINDEX, TROPONINI in the last 168 hours. BNP (last 3 results) No results for input(s): BNP in the last 8760 hours.  ProBNP (last 3 results) Recent Labs    03/19/24 0813  PROBNP 828.0*    CBG: No results for input(s): GLUCAP in the last 168 hours.  Radiological Exams on Admission: DG Chest Port 1 View Result Date: 03/19/2024 CLINICAL  DATA:  Shortness of breath.  Chest pain.  Generalized pain. EXAM: PORTABLE CHEST - 1 VIEW COMPARISON:  12/26/2016 FINDINGS: Cardiomediastinal silhouette and pulmonary vasculature are within normal limits. Linear opacity seen along the periphery of the lower lobes bilaterally suspicious for early interstitial pulmonary edema. Lungs are otherwise clear. IMPRESSION: Findings suspicious for early interstitial pulmonary edema. Electronically Signed   By: Aliene Lloyd M.D.   On: 03/19/2024 07:39   Assessment/Plan Trexton Escamilla is a 69 y.o. male with medical history significant for anxiety/depression, diet-controlled diabetes, hypertension, heart failure with preserved EF admitted to the hospital with dyspnea shortness of breath and symptomatic anemia.   Symptomatic anemia-with iron deficiency, suspected due to blood loss and may be some component of anemia of chronic disease given his recent diagnosis of prostate cancer.  Patient has refused rectal exam in the emergency department, but denies any frank bleeding or melena.  Notably, there is no blood in his urine. -Observation admission -Monitor closely on telemetry -Transfused 2 unit PRBC, check posttransfusion hemoglobin this afternoon -Oral PPI twice daily -Stool occult blood -If remains stable with improved symptoms and no evidence of active bleeding would  recommend outpatient GI evaluation.  Otherwise, may require inpatient GI consult.  Dyspnea with exertion-most likely due to his symptomatic anemia which is quite severe, along with pale appearance and cold intolerance.  However there could be some contribution from his heart failure although he does not look volume overloaded. -Was given IV Lasix 20 mg x 1 prior to blood transfusion  Hypokalemia-minimal, repleted orally, recheck potassium level with morning labs  Anxiety-Xanax twice daily as needed  Chronic pain syndrome-continue home dose Percocet every 6 hours as needed  Type 2 diabetes-well-controlled -Carb modified diet -Moderate dose sliding scale  DVT prophylaxis: SCDs only    Code Status: Full Code  Consults called: None  Admission status: Observation  Time spent: 49 minutes  Laren Whaling CHRISTELLA Gail MD [facility] Pager [phone]  If 7PM-7AM, please contact night-coverage [email or web address] Password [email or web address]  03/19/2024, 12:50 PM      [1]  Allergies Allergen Reactions   Ivp Dye [Iodinated Contrast Media] Anaphylaxis    Pt has EPI PEN for this allergy.   Shellfish Allergy Anaphylaxis    Pt has EPI PEN for this allergy.   Canagliflozin Swelling   Flexeril [Cyclobenzaprine] Hives   Ibuprofen Hives   Metformin And Related Swelling   Other Hives    Invocana = Makes BP high, hives   Robaxin [Methocarbamol] Hives   "

## 2024-03-20 DIAGNOSIS — I5032 Chronic diastolic (congestive) heart failure: Secondary | ICD-10-CM

## 2024-03-20 DIAGNOSIS — E119 Type 2 diabetes mellitus without complications: Secondary | ICD-10-CM

## 2024-03-20 DIAGNOSIS — I10 Essential (primary) hypertension: Secondary | ICD-10-CM | POA: Diagnosis not present

## 2024-03-20 DIAGNOSIS — D649 Anemia, unspecified: Secondary | ICD-10-CM | POA: Diagnosis not present

## 2024-03-20 DIAGNOSIS — I350 Nonrheumatic aortic (valve) stenosis: Secondary | ICD-10-CM | POA: Diagnosis not present

## 2024-03-20 LAB — BASIC METABOLIC PANEL WITH GFR
Anion gap: 13 (ref 5–15)
BUN: 5 mg/dL — ABNORMAL LOW (ref 8–23)
CO2: 23 mmol/L (ref 22–32)
Calcium: 10 mg/dL (ref 8.9–10.3)
Chloride: 105 mmol/L (ref 98–111)
Creatinine, Ser: 0.82 mg/dL (ref 0.61–1.24)
GFR, Estimated: >60 mL/min
Glucose, Bld: 118 mg/dL — ABNORMAL HIGH (ref 70–99)
Potassium: 3.8 mmol/L (ref 3.5–5.1)
Sodium: 140 mmol/L (ref 135–145)

## 2024-03-20 LAB — CBC
HCT: 32.4 % — ABNORMAL LOW (ref 39.0–52.0)
Hemoglobin: 9.6 g/dL — ABNORMAL LOW (ref 13.0–17.0)
MCH: 20.9 pg — ABNORMAL LOW (ref 26.0–34.0)
MCHC: 29.6 g/dL — ABNORMAL LOW (ref 30.0–36.0)
MCV: 70.6 fL — ABNORMAL LOW (ref 80.0–100.0)
Platelets: 408 K/uL — ABNORMAL HIGH (ref 150–400)
RBC: 4.59 MIL/uL (ref 4.22–5.81)
RDW: 21.1 % — ABNORMAL HIGH (ref 11.5–15.5)
WBC: 10.9 K/uL — ABNORMAL HIGH (ref 4.0–10.5)
nRBC: 0 % (ref 0.0–0.2)

## 2024-03-20 LAB — GLUCOSE, CAPILLARY
Glucose-Capillary: 135 mg/dL — ABNORMAL HIGH (ref 70–99)
Glucose-Capillary: 160 mg/dL — ABNORMAL HIGH (ref 70–99)

## 2024-03-20 LAB — HIV ANTIBODY (ROUTINE TESTING W REFLEX): HIV Screen 4th Generation wRfx: NONREACTIVE

## 2024-03-20 MED ORDER — OXYCODONE-ACETAMINOPHEN 5-325 MG PO TABS
1.0000 | ORAL_TABLET | ORAL | Status: DC | PRN
  Administered 2024-03-20: 1 via ORAL
  Filled 2024-03-20: qty 1

## 2024-03-20 MED ORDER — CARMEX CLASSIC LIP BALM EX OINT
1.0000 | TOPICAL_OINTMENT | CUTANEOUS | Status: DC | PRN
  Administered 2024-03-20: 1 via TOPICAL
  Filled 2024-03-20: qty 10

## 2024-03-20 NOTE — Plan of Care (Signed)
" °  Problem: Coping: Goal: Ability to adjust to condition or change in health will improve Outcome: Not Progressing   Problem: Activity: Goal: Risk for activity intolerance will decrease Outcome: Not Progressing   Problem: Coping: Goal: Level of anxiety will decrease Outcome: Not Progressing   Problem: Pain Managment: Goal: General experience of comfort will improve and/or be controlled Outcome: Not Progressing   "

## 2024-03-20 NOTE — Discharge Summary (Signed)
 " Physician Discharge Summary   Patient: Jonathon Rojas MRN: [id number] DOB: 1955-12-27  Admit date:     03/19/2024  Discharge date: {dischdate:26783}  Discharge Physician: Concepcion Riser   PCP: Nichole Senior, MD   Recommendations at discharge:  {Tip this will not be part of the note when signed- Example include specific recommendations for outpatient follow-up, pending tests to follow-up on. (Optional):26781}  ***  Discharge Diagnoses: Principal Problem:   Symptomatic anemia  Resolved Problems:   * No resolved hospital problems. [facility] Course: No notes on file  Assessment and Plan: No notes have been filed under this hospital service. Service: Hospitalist     {Tip this will not be part of the note when signed Body mass index is 25.52 kg/m. , ,  (Optional):26781}  {(NOTE) Pain control PDMP Statment (Optional):26782} Consultants: *** Procedures performed: ***  Disposition: {Plan; Disposition:26390} Diet recommendation:  Discharge Diet Orders (From admission, onward)     Start     Ordered   03/20/24 0000  Diet - low sodium heart healthy        03/20/24 1503   03/20/24 0000  Diet Carb Modified        03/20/24 1503           {Diet_Plan:26776} DISCHARGE MEDICATION: Allergies as of 03/20/2024       Reactions   Iodinated Contrast Media Anaphylaxis   Pt has EPI PEN for this allergy. Other Reaction(s): Unknown   Metformin Shortness Of Breath   Other Reaction(s): shortness of breath, sweats, back pain   Shellfish Allergy Anaphylaxis, Other (See Comments)   Pt has EPI PEN for this allergy   Canagliflozin Swelling   Other Reaction(s): rash, swelling   Cyclobenzaprine Hives   Other Reaction(s): Unknown   Ibuprofen Hives   Other Reaction(s): Unknown   Metformin And Related Swelling   Methocarbamol Hives   Other Hives   Invocana = Makes BP high, hives   Alogliptin-pioglitazone Itching, Swelling, Other (See Comments)   Other Reaction(s): Face swelling and  itching   Amoxicillin     Other Reaction(s): upset stomach   Amoxicillin -pot Clavulanate    Other Reaction(s): itching and diarrhea   Ciprofloxacin    Other Reaction(s): tendon problems in ankle   Pseudoephedrine Anxiety, Other (See Comments)   Made the patient very jittery        Medication List     STOP taking these medications    naproxen sodium 220 MG tablet Commonly known as: ALEVE       TAKE these medications    ALPHA LIPOIC ACID PO Take 1 tablet by mouth daily.   ALPRAZolam  0.25 MG tablet Commonly known as: XANAX  Take 0.25 mg by mouth See admin instructions. Take 0.25 mg by mouth in the late morning and evening   aspirin EC 81 MG tablet Take 81 mg by mouth in the morning. Swallow whole.   Azelastine HCl 137 MCG/SPRAY Soln Place 2 sprays into both nostrils 2 (two) times daily as needed (for rhinitis or allergies).   Cinnamon 500 MG capsule Take 500 mg by mouth 2 (two) times daily.   dicyclomine 10 MG capsule Commonly known as: BENTYL Take 10 mg by mouth 2 (two) times daily.   fluticasone 50 MCG/ACT nasal spray Commonly known as: FLONASE Place 1-2 sprays into both nostrils 2 (two) times daily as needed for allergies or rhinitis.   Gymnema Sylvestris Leaf Powd Take 2 Bags by mouth See admin instructions. Brew 2 bags as directed and drink by  mouth after supper every day   OVER THE COUNTER MEDICATION Take 1 tablet by mouth See admin instructions. Glucocil Combination Supplement - Take 1 tablet by mouth 2 times a day   oxyCODONE -acetaminophen  7.5-325 MG tablet Commonly known as: PERCOCET Take 1 tablet by mouth in the morning, at noon, in the evening, and at bedtime.   pantoprazole  40 MG tablet Commonly known as: PROTONIX  Take 40 mg by mouth 2 (two) times daily before a meal.   Tiadylt ER 360 MG 24 hr capsule Generic drug: diltiazem Take 360 mg by mouth daily.   tiZANidine 4 MG tablet Commonly known as: ZANAFLEX Take 4 mg by mouth 3 (three) times  daily as needed for muscle spasms.   Voltaren 1 % Gel Generic drug: diclofenac Sodium Apply 2 g topically See admin instructions. Apply 2 grams to bilateral knees 4 times a day   zolpidem 10 MG tablet Commonly known as: AMBIEN Take 10 mg by mouth at bedtime.        Discharge Exam: Filed Weights   03/19/24 0718  Weight: 73.9 kg   ***  Condition at discharge: {DC Condition:26389}  The results of significant diagnostics from this hospitalization (including imaging, microbiology, ancillary and laboratory) are listed below for reference.   Imaging Studies: DG Chest Port 1 View Result Date: 03/19/2024 CLINICAL DATA:  Shortness of breath.  Chest pain.  Generalized pain. EXAM: PORTABLE CHEST - 1 VIEW COMPARISON:  12/26/2016 FINDINGS: Cardiomediastinal silhouette and pulmonary vasculature are within normal limits. Linear opacity seen along the periphery of the lower lobes bilaterally suspicious for early interstitial pulmonary edema. Lungs are otherwise clear. IMPRESSION: Findings suspicious for early interstitial pulmonary edema. Electronically Signed   By: Aliene Lloyd M.D.   On: 03/19/2024 07:39    Microbiology: Results for orders placed or performed during the hospital encounter of 03/19/24  Resp panel by RT-PCR (RSV, Flu A&B, Covid) Anterior Nasal Swab     Status: None   Collection Time: 03/19/24  7:21 AM   Specimen: Anterior Nasal Swab  Result Value Ref Range Status   SARS Coronavirus 2 by RT PCR NEGATIVE NEGATIVE Final    Comment: (NOTE) SARS-CoV-2 target nucleic acids are NOT DETECTED.  The SARS-CoV-2 RNA is generally detectable in upper respiratory specimens during the acute phase of infection. The lowest concentration of SARS-CoV-2 viral copies this assay can detect is 138 copies/mL. A negative result does not preclude SARS-Cov-2 infection and should not be used as the sole basis for treatment or other patient management decisions. A negative result may occur with   improper specimen collection/handling, submission of specimen other than nasopharyngeal swab, presence of viral mutation(s) within the areas targeted by this assay, and inadequate number of viral copies(<138 copies/mL). A negative result must be combined with clinical observations, patient history, and epidemiological information. The expected result is Negative.  Fact Sheet for Patients:  bloggercourse.com  Fact Sheet for Healthcare Providers:  seriousbroker.it  This test is no t yet approved or cleared by the [place]  FDA and  has been authorized for detection and/or diagnosis of SARS-CoV-2 by FDA under an Emergency Use Authorization (EUA). This EUA will remain  in effect (meaning this test can be used) for the duration of the COVID-19 declaration under Section 564(b)(1) of the Act, 21 U.S.C.section 360bbb-3(b)(1), unless the authorization is terminated  or revoked sooner.       Influenza A by PCR NEGATIVE NEGATIVE Final   Influenza B by PCR NEGATIVE NEGATIVE Final  Comment: (NOTE) The Xpert Xpress SARS-CoV-2/FLU/RSV plus assay is intended as an aid in the diagnosis of influenza from Nasopharyngeal swab specimens and should not be used as a sole basis for treatment. Nasal washings and aspirates are unacceptable for Xpert Xpress SARS-CoV-2/FLU/RSV testing.  Fact Sheet for Patients: bloggercourse.com  Fact Sheet for Healthcare Providers: seriousbroker.it  This test is not yet approved or cleared by the [place]  FDA and has been authorized for detection and/or diagnosis of SARS-CoV-2 by FDA under an Emergency Use Authorization (EUA). This EUA will remain in effect (meaning this test can be used) for the duration of the COVID-19 declaration under Section 564(b)(1) of the Act, 21 U.S.C. section 360bbb-3(b)(1), unless the authorization is terminated or revoked.      Resp Syncytial Virus by PCR NEGATIVE NEGATIVE Final    Comment: (NOTE) Fact Sheet for Patients: bloggercourse.com  Fact Sheet for Healthcare Providers: seriousbroker.it  This test is not yet approved or cleared by the [place]  FDA and has been authorized for detection and/or diagnosis of SARS-CoV-2 by FDA under an Emergency Use Authorization (EUA). This EUA will remain in effect (meaning this test can be used) for the duration of the COVID-19 declaration under Section 564(b)(1) of the Act, 21 U.S.C. section 360bbb-3(b)(1), unless the authorization is terminated or revoked.  Performed at [facility], [address], [place], [place] [address]     Labs: CBC: Recent Labs  Lab 03/19/24 0813 03/19/24 2054 03/20/24 0550  WBC 11.1*  --  10.9*  HGB 6.4* 9.3* 9.6*  HCT 23.2* 30.8* 32.4*  MCV 66.3*  --  70.6*  PLT 406*  --  408*   Basic Metabolic Panel: Recent Labs  Lab 03/19/24 0813 03/20/24 0550  NA 138 140  K 3.3* 3.8  CL 102 105  CO2 25 23  GLUCOSE 206* 118*  BUN 10 5*  CREATININE 0.83 0.82  CALCIUM 9.4 10.0   Liver Function Tests: Recent Labs  Lab 03/19/24 0813  AST 32  ALT 25  ALKPHOS 104  BILITOT 0.3  PROT 7.2  ALBUMIN 4.5   CBG: Recent Labs  Lab 03/19/24 1713 03/19/24 2204 03/20/24 0729 03/20/24 1112  GLUCAP 147* 122* 135* 160*    Discharge time spent: {LESS THAN/GREATER THAN:26388} 30 minutes.  Signed: Concepcion Riser, MD [facility] 03/20/2024 "

## 2024-03-20 NOTE — Progress Notes (Signed)
 SATURATION QUALIFICATIONS: (This note is used to comply with regulatory documentation for home oxygen)  Patient Saturations on Room Air at Rest = 100%  Patient Saturations on Room Air while Ambulating = 99%  Patient does not have oxygen needs

## 2024-03-21 DIAGNOSIS — I5032 Chronic diastolic (congestive) heart failure: Secondary | ICD-10-CM | POA: Insufficient documentation

## 2024-03-22 LAB — BPAM RBC
Blood Product Expiration Date: 202601262359
Blood Product Expiration Date: 202601262359
ISSUE DATE / TIME: 202601021220
ISSUE DATE / TIME: 202601021636
Unit Type and Rh: 6200
Unit Type and Rh: 6200

## 2024-03-22 LAB — TYPE AND SCREEN
ABO/RH(D): A POS
Antibody Screen: NEGATIVE
Unit division: 0
Unit division: 0

## 2024-03-25 ENCOUNTER — Telehealth: Payer: Self-pay | Admitting: Internal Medicine

## 2024-03-25 NOTE — Telephone Encounter (Signed)
 Good evening Dr. Federico,   DOD PM 1/8   A stat referral was received for gastrointestinal hemorrhage. Patient's referral can be found in Epic under media tab for you to review and advise on scheduling.   Thank you

## 2024-04-06 NOTE — Telephone Encounter (Signed)
Left patient voicemail regarding scheduling.
